# Patient Record
Sex: Male | Born: 1980 | Race: White | Hispanic: No | Smoking: Current every day smoker
Health system: Southern US, Community
[De-identification: ages and names within clinical notes are randomized; demographics above are authoritative.]

## PROBLEM LIST (undated history)

## (undated) DIAGNOSIS — F1110 Opioid abuse, uncomplicated: Secondary | ICD-10-CM

## (undated) DIAGNOSIS — M549 Dorsalgia, unspecified: Secondary | ICD-10-CM

## (undated) HISTORY — PX: TONSILLECTOMY: SUR1361

## (undated) HISTORY — PX: FOOT SURGERY: SHX648

---

## 2006-08-03 ENCOUNTER — Observation Stay (HOSPITAL_COMMUNITY): Admission: AC | Admit: 2006-08-03 | Discharge: 2006-08-03 | Payer: Self-pay | Admitting: Orthopedic Surgery

## 2006-08-21 ENCOUNTER — Inpatient Hospital Stay (HOSPITAL_COMMUNITY): Admission: EM | Admit: 2006-08-21 | Discharge: 2006-08-26 | Payer: Self-pay | Admitting: Emergency Medicine

## 2006-08-22 ENCOUNTER — Ambulatory Visit: Payer: Self-pay | Admitting: Infectious Diseases

## 2006-08-22 ENCOUNTER — Ambulatory Visit: Payer: Self-pay | Admitting: Vascular Surgery

## 2007-03-26 ENCOUNTER — Emergency Department (HOSPITAL_COMMUNITY): Admission: EM | Admit: 2007-03-26 | Discharge: 2007-03-26 | Payer: Self-pay | Admitting: Emergency Medicine
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
## 2010-07-04 NOTE — Discharge Summary (Signed)
[name], [name]              ACCOUNT NO.:  [id number]   MEDICAL RECORD NO.:  [id number]          PATIENT TYPE:  INP   LOCATION:  5703                         FACILITY:  [facility]   PHYSICIAN:  Erasmo Leventhal, M.D.DATE OF BIRTH:  1981-01-11   DATE OF ADMISSION:  08/21/2006  DATE OF DISCHARGE:  08/26/2006                               DISCHARGE SUMMARY   ADMITTING DIAGNOSIS:  Left foot infection status post nail puncture.   DISCHARGE DIAGNOSIS:  Left foot infection status post nail puncture.   OPERATION:  I&D left foot and nail puncture.   BRIEF HISTORY:  This is a 30 year old young man who sustained a nail  puncture 4-5 days before admission. The nail went through his shoe and  into his foot. He had progressive swelling and edema. He was admitted to  the hospital for evaluation, IV antibiotics and subsequent I&D.  The  surgery risks, benefits and aftercare were discussed with the patient.  An ID consult will be obtained.   LABORATORY VALUES:  CBC on admission within normal limits with the  exception of high  monocytes at 1.1. BMET met on admission within normal  limits with the exception of slightly high glucose of 106.  Wound  culture showed multiple organisms none predominant, rare gram-positive  cocci in pairs. No staph aureus isolated, no group B strep isolated.  Anaerobic culture showed rare gram-positive cocci in pairs, no anaerobes  isolated.   HOSPITAL COURSE:  The patient tolerated the operative procedure well. A  through and through drain was placed. Postoperatively he was started on  vancomycin pending culture results. The first postoperative day vital  signs were stable, he was afebrile. No bone or joint involvement was  noted. He was seen by the ID service and the patient was continued on  Cipro pending culture results. He did have a problem with slightly low  pulse the first day postoperatively probably secondary to his young age,  thin body habitus and  narcotics and his narcotics were backed off and  this improved.  The second postoperative day, vital signs were stable.  He was afebrile. He was reasonably comfortable.  Cultures were still  pending.  The third postoperative day, his foot pain was much better,  his vital signs were stable.  He was seen by infectious disease service  and this showed a mixed bacteria.  No staph aureus and he was switched  to Cipro and Augmentin, Cipro 750 b.i.d. , Augmentin 875 b.i.d.,  recommendation 2-3 weeks total per the ID service.  At this time his  vital signs stable are stable, he is afebrile, O2 97 on room air.  Lungs  were clear.  Heart sounds normal.  Bowel sounds active.  Calves  negative.  Dressing was changed. He had mild swelling in his foot but no  redness or streaking. Minimal discomfort.  Neurovascular status intact.  His drain had been removed by Dr. Ranell Patrick on the previous day and the  patient was stable and subsequently ready for discharge home and is  discharged home today for follow-up in the office.   CONDITION ON DISCHARGE:  Improved.   FOLLOW-UP:  On Wednesday.   DISCHARGE MEDICATIONS:  1. Augmentin 875 b.i.d.  2. Cipro 750 b.i.d.  3. Norco 5/325 1 q. 6h p.r.n. pain.   He is instructed to his crutches, elevate, keep the wound clean and dry.  Call the office for an appointment on Wednesday or call sooner p.r.n.  problems.      [name]. Chabon, P.A.    ______________________________  Erasmo Leventhal, M.D.    SJC/MEDQ  D:  08/26/2006  T:  08/26/2006  Job:  [id number]

## 2010-07-04 NOTE — Op Note (Signed)
NAMENASER, SCHULD              ACCOUNT NO.:  [id number]   MEDICAL RECORD NO.:  [id number]          PATIENT TYPE:  INP   LOCATION:  5703                         FACILITY:  [facility]   PHYSICIAN:  Erasmo Leventhal, M.D.DATE OF BIRTH:  September 24, 1980   DATE OF PROCEDURE:  08/23/2006  DATE OF DISCHARGE:                               OPERATIVE REPORT   I was asked to see Mr. Jurado at Dr. Barry Dienes' request.  The chart was  reviewed.  The patient was interviewed and examined and all studies were  reviewed.  I discussed with the patient's family the nature of his  problem.  They agreed for surgical intervention.  They understood the  risks and benefits, also discussed.   PREOPERATIVE DIAGNOSIS:  Left foot infection status post nail puncture.   POSTOPERATIVE DIAGNOSIS:  Left foot infection status post nail puncture.   PROCEDURE:  Irrigation and debridement of left foot nail puncture wound  infection.   SURGEON:  Erasmo Leventhal, M.D.   ANESTHESIA:  General.   ESTIMATED BLOOD LOSS:  None.   COMPLICATIONS:  None.   DISPOSITION:  To PACU stable.   TOURNIQUET TIME:  Focal ankle tourniquet 10 minutes.   FINDINGS:  There appeared to be no joint nor bone involvement.  The  infection appeared to be in the soft tissues, plantar aspect and medial.  It was well decompressed and I also did a through and through drain  which was left in place.  Cultures were sent.   DETAILS:  The patient's family counseled in the holding area.  Taken to  the operating room, placed under anesthesia.  The left foot elevated,  prepped with DuraPrep and draped in a sterile fashion.  Local ankle  Esmarch was utilized, making sure I stayed proximal to the area of  cellulitis and infection.   On the plantar aspect of his foot in area of localized swelling, no  fluctuance, with simply localized swelling.  In addition, the joint was  palpated and there appeared to be no involvement of the joint.   On the  plantar aspect, the puncture wound was excised in an elliptical  fashion.  A hemostat was placed face down.  The puncture area seemed to  go to the medial aspect into the soft tissue.  It did not seem to go  into the joint nor the bone.  We tracked this up to the medial aspect  where the area of localized swelling was.  This was taken through the  hemostat and bluntly split in the neurovascular tissues.  I then went to  the dorsal aspect with the hemostat and made a small puncture wound.  Cultures were sent.  There was no frank pus but there was some thick  serous fluid decompressed.  It was copiously irrigated with saline  through and through.  I then took a small vessel loop as a through and  through drain and placed that into the wound.  The tourniquet was  removed.  Normal circulation of the  foot and the toe at end of the case and sterile dressings applied.  He  is awakened, to recovery room in stable condition.  Sponge and needle  count correct.  No complications or problems.  The patient did well in  the recovery room at the time of the dictation.           ______________________________  Erasmo Leventhal, M.D.     RAC/MEDQ  D:  08/23/2006  T:  08/23/2006  Job:  [id number]
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
## 2010-12-05 LAB — DIFFERENTIAL
Basophils Absolute: 0
Eosinophils Relative: 1
Lymphocytes Relative: 22
Lymphs Abs: 2.2
Monocytes Absolute: 1.1 — ABNORMAL HIGH

## 2010-12-05 LAB — BASIC METABOLIC PANEL
GFR calc non Af Amer: >60
Glucose, Bld: 106 — ABNORMAL HIGH
Potassium: 3.5
Sodium: 138

## 2010-12-05 LAB — ANAEROBIC CULTURE

## 2010-12-05 LAB — CBC
HCT: 41
Hemoglobin: 13.9
RDW: 13.3

## 2010-12-05 LAB — WOUND CULTURE

## 2010-12-07 LAB — I-STAT 8, (EC8 V) (CONVERTED LAB)
BUN: 8
Chloride: 104
Glucose, Bld: 89
Potassium: 3.7
pH, Ven: 7.363 — ABNORMAL HIGH

## 2011-05-07 ENCOUNTER — Emergency Department (HOSPITAL_COMMUNITY)
Admission: EM | Admit: 2011-05-07 | Discharge: 2011-05-07 | Disposition: A | Discharge status: Home / Self Care | Payer: Self-pay | Attending: Emergency Medicine | Admitting: Emergency Medicine

## 2011-05-07 ENCOUNTER — Encounter (HOSPITAL_COMMUNITY): Payer: Self-pay | Admitting: *Deleted

## 2011-05-07 DIAGNOSIS — M545 Low back pain, unspecified: Secondary | ICD-10-CM | POA: Insufficient documentation

## 2011-05-07 DIAGNOSIS — M5416 Radiculopathy, lumbar region: Secondary | ICD-10-CM

## 2011-05-07 DIAGNOSIS — X500XXA Overexertion from strenuous movement or load, initial encounter: Secondary | ICD-10-CM | POA: Insufficient documentation

## 2011-05-07 DIAGNOSIS — F17200 Nicotine dependence, unspecified, uncomplicated: Secondary | ICD-10-CM | POA: Insufficient documentation

## 2011-05-07 DIAGNOSIS — IMO0002 Reserved for concepts with insufficient information to code with codable children: Secondary | ICD-10-CM | POA: Insufficient documentation

## 2011-05-07 HISTORY — DX: Dorsalgia, unspecified: M54.9

## 2011-05-07 MED ORDER — HYDROCODONE-ACETAMINOPHEN 5-325 MG PO TABS
1.0000 | ORAL_TABLET | Freq: Once | ORAL | Status: AC
  Administered 2011-05-07: 1 via ORAL
  Filled 2011-05-07: qty 1

## 2011-05-07 MED ORDER — HYDROCODONE-ACETAMINOPHEN 5-325 MG PO TABS: ORAL_TABLET | ORAL | Status: DC

## 2011-05-07 MED ORDER — PREDNISONE 50 MG PO TABS: ORAL_TABLET | ORAL | Status: AC

## 2011-05-07 MED ORDER — PREDNISONE 20 MG PO TABS
60.0000 mg | ORAL_TABLET | Freq: Once | ORAL | Status: AC
  Administered 2011-05-07: 60 mg via ORAL
  Filled 2011-05-07: qty 3

## 2011-05-07 NOTE — ED Notes (Signed)
Pain low back with radiation down rt leg. Onset 12n when bending down to pick up a bag of dog food.Marland Kitchen

## 2011-05-07 NOTE — ED Provider Notes (Signed)
History     CSN: [id number]  Arrival date & time 05/07/11  1238   First MD Initiated Contact with Patient 05/07/11 1413      Chief Complaint  Patient presents with  . Back Pain    (Consider location/radiation/quality/duration/timing/severity/associated sxs/prior treatment) Patient is a 31 y.o. male presenting with back pain. The history is provided by the patient. No language interpreter was used.  Back Pain  This is a new problem. The current episode started less than 1 hour ago. The problem occurs constantly. The problem has not changed since onset.The pain is associated with lifting heavy objects (bent over to pick a bag of dog food and felt his lower back pop.  pain since with radiation to E mid thigh.  describes as "burning".). The pain is present in the lumbar spine. The quality of the pain is described as stabbing. The pain radiates to the right thigh. The pain is at a severity of 8/10. The symptoms are aggravated by bending and certain positions. The pain is the same all the time. He has tried heat (made pain worse.) for the symptoms. The treatment provided no relief.    Past Medical History  Diagnosis Date  . Back pain     History reviewed. No pertinent past surgical history.  No family history on file.  History  Substance Use Topics  . Smoking status: Current Everyday Smoker -- 0.5 packs/day  . Smokeless tobacco: Not on file  . Alcohol Use: Yes     socially      Review of Systems  Musculoskeletal: Positive for back pain.  All other systems reviewed and are negative.    Allergies  Review of patient's allergies indicates no known allergies.  Home Medications   Current Outpatient Rx  Name Route Sig Dispense Refill  . HYDROCODONE-ACETAMINOPHEN 5-325 MG PO TABS  One tab po q 4-6 hrs prn pain 20 tablet 0  . PREDNISONE 50 MG PO TABS  One tab po QD 6 tablet 0    BP 133/83  Pulse 60  Temp 97.6 F (36.4 C)  Resp 20  Ht 6\' 6"  (1.981 m)  Wt 210 lb (95.255  kg)  BMI 24.27 kg/m2  SpO2 100%  Physical Exam  Nursing note and vitals reviewed. Constitutional: He is oriented to person, place, and time. He appears well-developed and well-nourished.  HENT:  Head: Normocephalic and atraumatic.  Eyes: EOM are normal.  Neck: Normal range of motion.  Cardiovascular: Normal rate, regular rhythm, normal heart sounds and intact distal pulses.   Pulmonary/Chest: Effort normal and breath sounds normal. No respiratory distress.  Abdominal: Soft. He exhibits no distension. There is no tenderness.  Musculoskeletal:       Lumbar back: He exhibits decreased range of motion and pain. He exhibits no tenderness and no bony tenderness.       Back:  Neurological: He is alert and oriented to person, place, and time. Coordination normal.  Skin: Skin is warm and dry.  Psychiatric: He has a normal mood and affect. Judgment normal.    ED Course  Procedures (including critical care time)  Labs Reviewed - No data to display No results found.   1. Lumbar back pain   2. Acute lumbar radiculopathy       MDM  rx-prednisone 50 mg rx-hydrocodone Ice F/u with PCP        Worthy Rancher, PA 05/07/11 1531  Worthy Rancher, [place] 05/07/11 [phone]

## 2011-05-07 NOTE — Discharge Instructions (Signed)
Back Pain, Adult Low back pain is very common. About 1 in 5 people have back pain.The cause of low back pain is rarely dangerous. The pain often gets better over time.About half of people with a sudden onset of back pain feel better in just 2 weeks. About 8 in 10 people feel better by 6 weeks.  CAUSES Some common causes of back pain include:  Strain of the muscles or ligaments supporting the spine.   Wear and tear (degeneration) of the spinal discs.   Arthritis.   Direct injury to the back.  DIAGNOSIS Most of the time, the direct cause of low back pain is not known.However, back pain can be treated effectively even when the exact cause of the pain is unknown.Answering your caregiver's questions about your overall health and symptoms is one of the most accurate ways to make sure the cause of your pain is not dangerous. If your caregiver needs more information, he or she may order lab work or imaging tests (X-rays or MRIs).However, even if imaging tests show changes in your back, this usually does not require surgery. HOME CARE INSTRUCTIONS For many people, back pain returns.Since low back pain is rarely dangerous, it is often a condition that people can learn to manageon their own.   Remain active. It is stressful on the back to sit or stand in one place. Do not sit, drive, or stand in one place for more than 30 minutes at a time. Take short walks on level surfaces as soon as pain allows.Try to increase the length of time you walk each day.   Do not stay in bed.Resting more than 1 or 2 days can delay your recovery.   Do not avoid exercise or work.Your body is made to move.It is not dangerous to be active, even though your back may hurt.Your back will likely heal faster if you return to being active before your pain is gone.   Pay attention to your body when you bend and lift. Many people have less discomfortwhen lifting if they bend their knees, keep the load close to their  bodies,and avoid twisting. Often, the most comfortable positions are those that put less stress on your recovering back.   Find a comfortable position to sleep. Use a firm mattress and lie on your side with your knees slightly bent. If you lie on your back, put a pillow under your knees.   Only take over-the-counter or prescription medicines as directed by your caregiver. Over-the-counter medicines to reduce pain and inflammation are often the most helpful.Your caregiver may prescribe muscle relaxant drugs.These medicines help dull your pain so you can more quickly return to your normal activities and healthy exercise.   Put ice on the injured area.   Put ice in a plastic bag.   Place a towel between your skin and the bag.   Leave the ice on for 15 to 20 minutes, 3 to 4 times a day for the first 2 to 3 days. After that, ice and heat may be alternated to reduce pain and spasms.   Ask your caregiver about trying back exercises and gentle massage. This may be of some benefit.   Avoid feeling anxious or stressed.Stress increases muscle tension and can worsen back pain.It is important to recognize when you are anxious or stressed and learn ways to manage it.Exercise is a great option.  SEEK MEDICAL CARE IF:  You have pain that is not relieved with rest or medicine.   You have   pain that does not improve in 1 week.   You have new symptoms.   You are generally not feeling well.  SEEK IMMEDIATE MEDICAL CARE IF:   You have pain that radiates from your back into your legs.   You develop new bowel or bladder control problems.   You have unusual weakness or numbness in your arms or legs.   You develop nausea or vomiting.   You develop abdominal pain.   You feel faint.  Document Released: 02/05/2005 Document Revised: 01/25/2011 Document Reviewed: 06/26/2010 [facility] Patient Information 2012 [place], [place].Cryotherapy Cryotherapy means treatment with cold. Ice or gel packs can be  used to reduce both pain and swelling. Ice is the most helpful within the first 24 to 48 hours after an injury or flareup from overusing a muscle or joint. Sprains, strains, spasms, burning pain, shooting pain, and aches can all be eased with ice. Ice can also be used when recovering from surgery. Ice is effective, has very few side effects, and is safe for most people to use. PRECAUTIONS  Ice is not a safe treatment option for people with:  Raynaud's phenomenon. This is a condition affecting small blood vessels in the extremities. Exposure to cold may cause your problems to return.   Cold hypersensitivity. There are many forms of cold hypersensitivity, including:   Cold urticaria. Red, itchy hives appear on the skin when the tissues begin to warm after being iced.   Cold erythema. This is a red, itchy rash caused by exposure to cold.   Cold hemoglobinuria. Red blood cells break down when the tissues begin to warm after being iced. The hemoglobin that carry oxygen are passed into the urine because they cannot combine with blood proteins fast enough.   Numbness or altered sensitivity in the area being iced.  If you have any of the following conditions, do not use ice until you have discussed cryotherapy with your caregiver:  Heart conditions, such as arrhythmia, angina, or chronic heart disease.   High blood pressure.   Healing wounds or open skin in the area being iced.   Current infections.   Rheumatoid arthritis.   Poor circulation.   Diabetes.  Ice slows the blood flow in the region it is applied. This is beneficial when trying to stop inflamed tissues from spreading irritating chemicals to surrounding tissues. However, if you expose your skin to cold temperatures for too long or without the proper protection, you can damage your skin or nerves. Watch for signs of skin damage due to cold. HOME CARE INSTRUCTIONS Follow these tips to use ice and cold packs safely.  Place a dry or  damp towel between the ice and skin. A damp towel will cool the skin more quickly, so you may need to shorten the time that the ice is used.   For a more rapid response, add gentle compression to the ice.   Ice for no more than 10 to 20 minutes at a time. The bonier the area you are icing, the less time it will take to get the benefits of ice.   Check your skin after 5 minutes to make sure there are no signs of a poor response to cold or skin damage.   Rest 20 minutes or more in between uses.   Once your skin is numb, you can end your treatment. You can test numbness by very lightly touching your skin. The touch should be so light that you do not see the skin dimple from  the pressure of your fingertip. When using ice, most people will feel these normal sensations in this order: cold, burning, aching, and numbness.   Do not use ice on someone who cannot communicate their responses to pain, such as small children or people with dementia.  HOW TO MAKE AN ICE PACK Ice packs are the most common way to use ice therapy. Other methods include ice massage, ice baths, and cryo-sprays. Muscle creams that cause a cold, tingly feeling do not offer the same benefits that ice offers and should not be used as a substitute unless recommended by your caregiver. To make an ice pack, do one of the following:  Place crushed ice or a bag of frozen vegetables in a sealable plastic bag. Squeeze out the excess air. Place this bag inside another plastic bag. Slide the bag into a pillowcase or place a damp towel between your skin and the bag.   Mix 3 parts water with 1 part rubbing alcohol. Freeze the mixture in a sealable plastic bag. When you remove the mixture from the freezer, it will be slushy. Squeeze out the excess air. Place this bag inside another plastic bag. Slide the bag into a pillowcase or place a damp towel between your skin and the bag.  SEEK MEDICAL CARE IF:  You develop white spots on your skin. This  may give the skin a blotchy (mottled) appearance.   Your skin turns blue or pale.   Your skin becomes waxy or hard.   Your swelling gets worse.  MAKE SURE YOU:   Understand these instructions.   Will watch your condition.   Will get help right away if you are not doing well or get worse.  Document Released: 10/02/2010 Document Revised: 01/25/2011 Document Reviewed: 10/02/2010 [facility] Patient Information 2012 [place], [place].   Take the meds as directed.  Apply ice 20-30 min several times daily.  Do NOT take any NSAID's  While taking the prednisone.  Follow up with your PCP.  If your symptoms worsen or don't improve you may need an MRI to further evaluate.

## 2011-05-07 NOTE — ED Notes (Signed)
sttaes he was bending over to get a bag of dog food and his back began to hurt, c/o pain in lower back

## 2011-05-11 NOTE — ED Provider Notes (Signed)
Medical screening examination/treatment/procedure(s) were performed by non-physician practitioner and as supervising physician I was immediately available for consultation/collaboration.   Shelda Jakes, MD 05/11/11 1003

## 2015-03-15 ENCOUNTER — Encounter (HOSPITAL_COMMUNITY): Payer: Self-pay | Admitting: Emergency Medicine

## 2015-03-15 ENCOUNTER — Emergency Department (HOSPITAL_COMMUNITY)
Admission: EM | Admit: 2015-03-15 | Discharge: 2015-03-15 | Disposition: A | Discharge status: Home / Self Care | Payer: Self-pay | Attending: Emergency Medicine | Admitting: Emergency Medicine

## 2015-03-15 DIAGNOSIS — R110 Nausea: Secondary | ICD-10-CM | POA: Insufficient documentation

## 2015-03-15 DIAGNOSIS — R590 Localized enlarged lymph nodes: Secondary | ICD-10-CM | POA: Insufficient documentation

## 2015-03-15 DIAGNOSIS — F17210 Nicotine dependence, cigarettes, uncomplicated: Secondary | ICD-10-CM | POA: Insufficient documentation

## 2015-03-15 DIAGNOSIS — J039 Acute tonsillitis, unspecified: Secondary | ICD-10-CM | POA: Insufficient documentation

## 2015-03-15 MED ORDER — AMOXICILLIN 500 MG PO CAPS: 500.0000 mg | ORAL_CAPSULE | Freq: Three times a day (TID) | ORAL | Status: DC

## 2015-03-15 MED ORDER — HYDROCODONE-ACETAMINOPHEN 5-325 MG PO TABS: 1.0000 | ORAL_TABLET | Freq: Four times a day (QID) | ORAL | Status: DC | PRN

## 2015-03-15 NOTE — ED Notes (Signed)
Patient complaining of body aches with sore throat since yesterday.

## 2015-03-15 NOTE — Discharge Instructions (Signed)
Continue to take ibuprofen as needed for fever or pain. Do not take the narcotic that we are giving you for pain and cough if you are driving as it will make you sleepy.   Tonsillitis Tonsillitis is an infection of the throat that causes the tonsils to become red, tender, and swollen. Tonsils are collections of lymphoid tissue at the back of the throat. Each tonsil has crevices (crypts). Tonsils help fight nose and throat infections and keep infection from spreading to other parts of the body for the first 18 months of life.  CAUSES Sudden (acute) tonsillitis is usually caused by infection with streptococcal bacteria. Long-lasting (chronic) tonsillitis occurs when the crypts of the tonsils become filled with pieces of food and bacteria, which makes it easy for the tonsils to become repeatedly infected. SYMPTOMS  Symptoms of tonsillitis include:  A sore throat, with possible difficulty swallowing.  White patches on the tonsils.  Fever.  Tiredness.  New episodes of snoring during sleep, when you did not snore before.  Small, foul-smelling, yellowish-white pieces of material (tonsilloliths) that you occasionally cough up or spit out. The tonsilloliths can also cause you to have bad breath. DIAGNOSIS Tonsillitis can be diagnosed through a physical exam. Diagnosis can be confirmed with the results of lab tests, including a throat culture. TREATMENT  The goals of tonsillitis treatment include the reduction of the severity and duration of symptoms and prevention of associated conditions. Symptoms of tonsillitis can be improved with the use of steroids to reduce the swelling. Tonsillitis caused by bacteria can be treated with antibiotic medicines. Usually, treatment with antibiotic medicines is started before the cause of the tonsillitis is known. However, if it is determined that the cause is not bacterial, antibiotic medicines will not treat the tonsillitis. If attacks of tonsillitis are severe and  frequent, your health care provider may recommend surgery to remove the tonsils (tonsillectomy). HOME CARE INSTRUCTIONS   Rest as much as possible and get plenty of sleep.  Drink plenty of fluids. While the throat is very sore, eat soft foods or liquids, such as sherbet, soups, or instant breakfast drinks.  Eat frozen ice pops.  Gargle with a warm or cold liquid to help soothe the throat. Mix 1/4 teaspoon of salt and 1/4 teaspoon of baking soda in 8 oz of water. SEEK MEDICAL CARE IF:   Large, tender lumps develop in your neck.  A rash develops.  A green, yellow-brown, or bloody substance is coughed up.  You are unable to swallow liquids or food for 24 hours.  You notice that only one of the tonsils is swollen. SEEK IMMEDIATE MEDICAL CARE IF:   You develop any new symptoms such as vomiting, severe headache, stiff neck, chest pain, or trouble breathing or swallowing.  You have severe throat pain along with drooling or voice changes.  You have severe pain, unrelieved with recommended medications.  You are unable to fully open the mouth.  You develop redness, swelling, or severe pain anywhere in the neck.  You have a fever. MAKE SURE YOU:   Understand these instructions.  Will watch your condition.  Will get help right away if you are not doing well or get worse.   This information is not intended to replace advice given to you by your health care provider. Make sure you discuss any questions you have with your health care provider.   Document Released: 11/15/2004 Document Revised: 02/26/2014 Document Reviewed: 07/25/2012 Elsevier Interactive Patient Education [employer]

## 2015-03-15 NOTE — ED Provider Notes (Signed)
CSN: [id number]     Arrival date & time 03/15/15  1609 History   First MD Initiated Contact with Patient 03/15/15 1635     Chief Complaint  Patient presents with  . Sore Throat  . Generalized Body Aches     (Consider location/radiation/quality/duration/timing/severity/associated sxs/prior Treatment) Patient is a 35 y.o. male presenting with pharyngitis. The history is provided by the patient.  Sore Throat This is a new problem. The current episode started yesterday. The problem occurs constantly. The problem has been gradually worsening. Associated symptoms include chills, a fever, nausea, a sore throat and swollen glands. The symptoms are aggravated by swallowing. He has tried acetaminophen and NSAIDs for the symptoms. The treatment provided mild relief.   [name] is a 35 y.o. male who presents to the ED with sore throat, fever and body aches that started yesterday.  Past Medical History  Diagnosis Date  . Back pain    Past Surgical History  Procedure Laterality Date  . Foot surgery     History reviewed. No pertinent family history. Social History  Substance Use Topics  . Smoking status: Current Every Day Smoker -- 0.50 packs/day    Types: Cigarettes  . Smokeless tobacco: None  . Alcohol Use: No     Comment: denies    Review of Systems  Constitutional: Positive for fever and chills.  HENT: Positive for sore throat.   Gastrointestinal: Positive for nausea.   All other systems negtive   Allergies  Review of patient's allergies indicates no known allergies.  Home Medications   Prior to Admission medications   Medication Sig Start Date End Date Taking? Authorizing Provider  amoxicillin (AMOXIL) 500 MG capsule Take 1 capsule (500 mg total) by mouth 3 (three) times daily. 03/15/15   Hope Orlene Och, NP  HYDROcodone-acetaminophen (NORCO/VICODIN) 5-325 MG tablet Take 1 tablet by mouth every 6 (six) hours as needed (cough and pain). 03/15/15   Hope Orlene Och, NP   BP  132/74 mmHg  Pulse 80  Temp(Src) 99.3 F (37.4 C) (Oral)  Resp 16  Ht  (1.981 m)  Wt 99.791 kg  BMI 25.43 kg/m2  SpO2 99% Physical Exam  Constitutional: He is oriented to person, place, and time. He appears well-developed and well-nourished. No distress.  HENT:  Head: Normocephalic and atraumatic.  Right Ear: Tympanic membrane normal.  Left Ear: Tympanic membrane normal.  Nose: Rhinorrhea present.  Mouth/Throat: Uvula is midline and mucous membranes are normal. Oropharyngeal exudate and posterior oropharyngeal erythema present.  Tonsils enlarged with exudate  Eyes: EOM are normal.  Neck: Normal range of motion. Neck supple.  Cardiovascular: Normal rate and regular rhythm.   Pulmonary/Chest: Effort normal. He has no wheezes. He has no rales.  Abdominal: Soft. Bowel sounds are normal. There is no tenderness.  Musculoskeletal: Normal range of motion.  Lymphadenopathy:    He has cervical adenopathy.  Neurological: He is alert and oriented to person, place, and time. No cranial nerve deficit.  Skin: Skin is warm and dry.  Psychiatric: He has a normal mood and affect. His behavior is normal.  Nursing note and vitals reviewed.   ED Course  Procedures  MDM  35 y.o. male with sore throat, fever and body aches that started 24 hours prior to arrival to the ED. Stable for d/c without difficulty swallowing and no tonsillar abscess. Will treat for tonsillitis and pain. Patient to follow up with his PCP or return here if symptoms worsen. Discussed with the patient and  all questioned fully answered.   Final diagnoses:  Tonsillitis with exudate     Janne Napoleon, NP 03/16/15 1420  Lavera Guise, MD 03/16/15 [phone]

## 2015-03-30 ENCOUNTER — Emergency Department (HOSPITAL_COMMUNITY)
Admission: EM | Admit: 2015-03-30 | Discharge: 2015-03-30 | Disposition: A | Discharge status: Home / Self Care | Payer: Self-pay | Attending: Emergency Medicine | Admitting: Emergency Medicine

## 2015-03-30 ENCOUNTER — Emergency Department (HOSPITAL_COMMUNITY): Payer: Self-pay

## 2015-03-30 ENCOUNTER — Encounter (HOSPITAL_COMMUNITY): Payer: Self-pay | Admitting: Emergency Medicine

## 2015-03-30 DIAGNOSIS — R9431 Abnormal electrocardiogram [ECG] [EKG]: Secondary | ICD-10-CM | POA: Insufficient documentation

## 2015-03-30 DIAGNOSIS — F17210 Nicotine dependence, cigarettes, uncomplicated: Secondary | ICD-10-CM | POA: Insufficient documentation

## 2015-03-30 DIAGNOSIS — R079 Chest pain, unspecified: Secondary | ICD-10-CM | POA: Insufficient documentation

## 2015-03-30 LAB — BASIC METABOLIC PANEL
Anion gap: 10 (ref 5–15)
BUN: 12 mg/dL (ref 6–20)
CALCIUM: 9.3 mg/dL (ref 8.9–10.3)
CO2: 27 mmol/L (ref 22–32)
Chloride: 100 mmol/L — ABNORMAL LOW (ref 101–111)
Creatinine, Ser: 1.03 mg/dL (ref 0.61–1.24)
GFR calc Af Amer: >60 mL/min (ref >60)
GLUCOSE: 98 mg/dL (ref 65–99)
POTASSIUM: 4.2 mmol/L (ref 3.5–5.1)
Sodium: 137 mmol/L (ref 135–145)

## 2015-03-30 LAB — CBC
HEMATOCRIT: 41.3 % (ref 39.0–52.0)
HEMOGLOBIN: 13.9 g/dL (ref 13.0–17.0)
MCH: 29.9 pg (ref 26.0–34.0)
MCHC: 33.7 g/dL (ref 30.0–36.0)
MCV: 88.8 fL (ref 78.0–100.0)
Platelets: 272 10*3/uL (ref 150–400)
RBC: 4.65 MIL/uL (ref 4.22–5.81)
RDW: 13.2 % (ref 11.5–15.5)
WBC: 7.4 10*3/uL (ref 4.0–10.5)

## 2015-03-30 LAB — TROPONIN I: Troponin I: <0.03 ng/mL (ref <0.031)

## 2015-03-30 MED ORDER — ONDANSETRON HCL 4 MG/2ML IJ SOLN
4.0000 mg | Freq: Once | INTRAMUSCULAR | Status: AC
  Administered 2015-03-30: 4 mg via INTRAVENOUS
  Filled 2015-03-30: qty 2

## 2015-03-30 MED ORDER — ASPIRIN 81 MG PO CHEW
324.0000 mg | CHEWABLE_TABLET | Freq: Once | ORAL | Status: AC
  Administered 2015-03-30: 324 mg via ORAL
  Filled 2015-03-30: qty 4

## 2015-03-30 MED ORDER — MORPHINE SULFATE (PF) 4 MG/ML IV SOLN
4.0000 mg | Freq: Once | INTRAVENOUS | Status: AC
  Administered 2015-03-30: 4 mg via INTRAVENOUS
  Filled 2015-03-30: qty 1

## 2015-03-30 NOTE — Discharge Instructions (Signed)
Try to avoid stressful situations. Consider stopping tobacco use. Follow-up with a primary care doctor your abnormal EKG in 2-3 weeks. Return here, if needed, for problems.  Nonspecific Chest Pain  Chest pain can be caused by many different conditions. There is always a chance that your pain could be related to something serious, such as a heart attack or a blood clot in your lungs. Chest pain can also be caused by conditions that are not life-threatening. If you have chest pain, it is very important to follow up with your health care provider. CAUSES  Chest pain can be caused by:  Heartburn.  Pneumonia or bronchitis.  Anxiety or stress.  Inflammation around your heart (pericarditis) or lung (pleuritis or pleurisy).  A blood clot in your lung.  A collapsed lung (pneumothorax). It can develop suddenly on its own (spontaneous pneumothorax) or from trauma to the chest.  Shingles infection (varicella-zoster virus).  Heart attack.  Damage to the bones, muscles, and cartilage that make up your chest wall. This can include:  Bruised bones due to injury.  Strained muscles or cartilage due to frequent or repeated coughing or overwork.  Fracture to one or more ribs.  Sore cartilage due to inflammation (costochondritis). RISK FACTORS  Risk factors for chest pain may include:  Activities that increase your risk for trauma or injury to your chest.  Respiratory infections or conditions that cause frequent coughing.  Medical conditions or overeating that can cause heartburn.  Heart disease or family history of heart disease.  Conditions or health behaviors that increase your risk of developing a blood clot.  Having had chicken pox (varicella zoster). SIGNS AND SYMPTOMS Chest pain can feel like:  Burning or tingling on the surface of your chest or deep in your chest.  Crushing, pressure, aching, or squeezing pain.  Dull or sharp pain that is worse when you move, cough, or take  a deep breath.  Pain that is also felt in your back, neck, shoulder, or arm, or pain that spreads to any of these areas. Your chest pain may come and go, or it may stay constant. DIAGNOSIS Lab tests or other studies may be needed to find the cause of your pain. Your health care provider may have you take a test called an ambulatory ECG (electrocardiogram). An ECG records your heartbeat patterns at the time the test is performed. You may also have other tests, such as:  Transthoracic echocardiogram (TTE). During echocardiography, sound waves are used to create a picture of all of the heart structures and to look at how blood flows through your heart.  Transesophageal echocardiogram (TEE).This is a more advanced imaging test that obtains images from inside your body. It allows your health care provider to see your heart in finer detail.  Cardiac monitoring. This allows your health care provider to monitor your heart rate and rhythm in real time.  Holter monitor. This is a portable device that records your heartbeat and can help to diagnose abnormal heartbeats. It allows your health care provider to track your heart activity for several days, if needed.  Stress tests. These can be done through exercise or by taking medicine that makes your heart beat more quickly.  Blood tests.  Imaging tests. TREATMENT  Your treatment depends on what is causing your chest pain. Treatment may include:  Medicines. These may include:  Acid blockers for heartburn.  Anti-inflammatory medicine.  Pain medicine for inflammatory conditions.  Antibiotic medicine, if an infection is present.  Medicines to  dissolve blood clots.  Medicines to treat coronary artery disease.  Supportive care for conditions that do not require medicines. This may include:  Resting.  Applying heat or cold packs to injured areas.  Limiting activities until pain decreases. HOME CARE INSTRUCTIONS  If you were prescribed an  antibiotic medicine, finish it all even if you start to feel better.  Avoid any activities that bring on chest pain.  Do not use any tobacco products, including cigarettes, chewing tobacco, or electronic cigarettes. If you need help quitting, ask your health care provider.  Do not drink alcohol.  Take medicines only as directed by your health care provider.  Keep all follow-up visits as directed by your health care provider. This is important. This includes any further testing if your chest pain does not go away.  If heartburn is the cause for your chest pain, you may be told to keep your head raised (elevated) while sleeping. This reduces the chance that acid will go from your stomach into your esophagus.  Make lifestyle changes as directed by your health care provider. These may include:  Getting regular exercise. Ask your health care provider to suggest some activities that are safe for you.  Eating a heart-healthy diet. A registered dietitian can help you to learn healthy eating options.  Maintaining a healthy weight.  Managing diabetes, if necessary.  Reducing stress. SEEK MEDICAL CARE IF:  Your chest pain does not go away after treatment.  You have a rash with blisters on your chest.  You have a fever. SEEK IMMEDIATE MEDICAL CARE IF:   Your chest pain is worse.  You have an increasing cough, or you cough up blood.  You have severe abdominal pain.  You have severe weakness.  You faint.  You have chills.  You have sudden, unexplained chest discomfort.  You have sudden, unexplained discomfort in your arms, back, neck, or jaw.  You have shortness of breath at any time.  You suddenly start to sweat, or your skin gets clammy.  You feel nauseous or you vomit.  You suddenly feel light-headed or dizzy.  Your heart begins to beat quickly, or it feels like it is skipping beats. These symptoms may represent a serious problem that is an emergency. Do not wait to  see if the symptoms will go away. Get medical help right away. Call your local emergency services (911 in the [place] Do not drive yourself to the hospital.   This information is not intended to replace advice given to you by your health care provider. Make sure you discuss any questions you have with your health care provider.   Document Released: 11/15/2004 Document Revised: 02/26/2014 Document Reviewed: 09/11/2013 Elsevier Interactive Patient Education 2016 [employer].  Emergency Department Resource Guide 1) Find a Doctor and Pay Out of Pocket Although you won't have to find out who is covered by your insurance plan, it is a good idea to ask around and get recommendations. You will then need to call the office and see if the doctor you have chosen will accept you as a new patient and what types of options they offer for patients who are self-pay. Some doctors offer discounts or will set up payment plans for their patients who do not have insurance, but you will need to ask so you aren't surprised when you get to your appointment.  2) Contact Your Local Health Department Not all health departments have doctors that can see patients for sick visits, but many do, so  it is worth a call to see if yours does. If you don't know where your local health department is, you can check in your phone book. The CDC also has a tool to help you locate your state's health department, and many state websites also have listings of all of their local health departments.  3) Find a Walk-in Clinic If your illness is not likely to be very severe or complicated, you may want to try a walk in clinic. These are popping up all over the country in pharmacies, drugstores, and shopping centers. They're usually staffed by nurse practitioners or physician assistants that have been trained to treat common illnesses and complaints. They're usually fairly quick and inexpensive. However, if you have serious medical issues or chronic  medical problems, these are probably not your best option.  No Primary Care Doctor: - Call Health Connect at  [phone] - they can help you locate a primary care doctor that  accepts your insurance, provides certain services, etc. - Physician Referral Service- [phone]  Chronic Pain Problems: Organization         Address  Phone   Notes  [facility]  [phone] Patients need to be referred by their primary care doctor.   Medication Assistance: Organization         Address  Phone   Notes  [facility] Medication [facility] [address] [place], [place] [address] [phone] --Must be a resident of [facility] -- Must have NO insurance coverage whatsoever (no Medicaid/ Medicare, etc.) -- The pt. MUST have a primary care doctor that directs their care regularly and follows them in the community   [facility]ist  [phone]   [employer]  [phone]    Agencies that provide inexpensive medical care: Organization         Address  Phone   Notes  [facility]  [phone]   [facility]    [phone]   [facility] [address] [place], [place] [address] [phone]   [facility] [address] [place], [place] [phone]   Planned Parenthood    [phone]   [facility]    [phone]   [facility]  [address], [place] Phone:  [phone], Fax:  [phone] Hours of Operation:  9 am - 6 pm, M-F.  Also accepts Medicaid/Medicare and self-pay.  [facility]  [address], [place] Phone: [phone], Fax: [phone]. Hours of Operation:  8:30 am - 5:30 pm, M-F.  Also accepts Medicaid and self-pay.  [facility] [place] [address], [place] Phone: [phone]   [facility] [address] Natasha Bence [place], [place] [phone],  Ext. [phone] Mondays & Thursdays: 7-9 AM.  First 15 patients are seen on a first come, first serve basis.    Medicaid-accepting [facility] Providers:  Organization         Address  Phone   Notes  [facility] [address], [place] [phone] Also accepts self-pay patients.  [facility] [address] Laurell Josephs [place], [place]  [phone]   [facility] [address], [place] [phone]   [facility] [address], [place] [phone]   Renaye Rakers [address], [place]   [phone] Only accepts [place] patients after they have their name applied to their card.   Self-Pay (no insurance) in [facility]:  Organization         Address  Phone   Notes  Sickle Cell Patients, [facility] Internal Medicine [address] [place], [place] [phone]   [facility] Urgent Care [address] [place], [place] [phone]   [facility] [place]  [address], [place] [phone]   [facility]/Dr. Osei-Bonsu  [address] [place] or [address], [place] [phone] Phone number for both [place] and [place] locations is the same.  [facility] [address] [place] [phone]   [facility] [address], [place] or [address] [phone]   [facility] [address] [place] [phone], phone; [phone], fax Sees patients 1st and 3rd Saturday of every month.  Must not qualify for public or private insurance (i.e. Medicaid, Medicare, [place] Health Choice, Veterans' Benefits)  Household income should be no more than 200% of the poverty level The clinic cannot treat you if you are pregnant or think you are pregnant  Sexually transmitted diseases are not  treated at the clinic.    Dental Care: Organization         Address  Phone  Notes  [facility] Department of [facility] [address] [place], [place] [phone] Accepts children up to age 56 who are enrolled in [place] or [place] Health Choice; pregnant women with a Medicaid card; and children who have applied for Medicaid or [place] Health Choice, but were declined, whose parents can pay a reduced fee at time of service.  [facility] Department of [facility]  [address], [place] [phone] Accepts children up to age 65 who are enrolled in [place] or [place] Health Choice; pregnant women with a Medicaid card; and children who have applied for Medicaid or [place] Health Choice, but were declined, whose parents can pay a reduced fee at time of service.  [facility]  [address] [place], [place] [phone] Patients are seen by appointment only. Walk-ins are not accepted. [facility] will see patients 65 years of age and older. Monday - Tuesday (8am-5pm) Most Wednesdays (8:30-5pm) $30 per visit, cash only  [facility] Adult Dental Access PROGRAM  [address], [facility] [phone] Patients are seen by appointment only. Walk-ins are not accepted. [facility] will see patients 89 years of age and older. One Wednesday Evening (Monthly: Volunteer Based).  $30 per visit, cash only  [employer]  [phone] for adults; Children under age 58, call Graduate Pediatric Dentistry at [phone]. Children aged 19-14, please call [phone] to request a pediatric application.  Dental services are provided in all areas of dental care including fillings, crowns and bridges, complete and partial dentures, implants, gum treatment, root canals, and extractions. Preventive care is also provided. Treatment is provided to both adults and children. Patients are selected via a lottery and there is  often a waiting list.   [facility] [address], [place]  [phone] [email or web address]   [facility] [address] [place], [place] [phone], Ext. [phone] Second and Fourth  Thursday of each month, opens at 6:30 AM; Clinic ends at 9 AM.  Patients are seen on a first-come first-served basis, and a limited number are seen during each clinic.   [facility]  [address] Ether Griffins [place], [place] [phone]   Eligibility Requirements You must have lived in [place], [place], or [place] counties for at least the last three months.   You cannot be eligible for state or federal sponsored [place], including [employer], [place], or [employer].   You generally cannot be eligible for healthcare insurance through your employer.    How to apply: Eligibility screenings are held every Tuesday and Wednesday afternoon from 1:00 pm until 4:00 pm. You do not need an appointment for the interview!  [facility] [address], [place], [place] [phone]   [facility] Health Department  [phone]   [facility] Health Department  [phone]   [facility] Health Department  [phone]    Behavioral Health Resources in the Community: Intensive Outpatient Programs Organization         Address  Phone  Notes  [facility] Services [address] [place], [place] [phone]   [facility] Outpatient [address], [place], [place] [phone]   [facility] [address] [place], [place]  [phone]   [facility] Mental Health [address]  [place], [place] [phone] or [phone]   Substance Abuse Resources Organization         Address  Phone  Notes  [facility]  [phone]   [facility]  [phone]   [facility]  [phone]   Floydene Flock  [phone]   Residential & Outpatient Substance  Abuse Program  [phone]   Psychological Services Organization         Address  Phone  Notes  [facility] Behavioral Health  [phone]   [facility] Services  [phone]   [facility] Mental Health [address] [place] [phone] or [phone]    Mobile Crisis Teams Organization         Address  Phone  Notes  [facility], Mobile Crisis Care Unit  [phone]   [facility]  [address] [place], [place] [phone]   Doristine Locks [address] [place] [phone]    Self-Help/Support Groups Organization         Address  Phone             Notes  [facility] [place] - variety of support groups  [phone]- I7437963 Call for more information  Narcotics Anonymous (NA), [facility] [address], [employer] [place]  2 meetings at this location   Statistician         Address  Phone  Notes  [facility] [address],    [place]  [phone]   [facility]  [address], [place] [address], [place], [place] [phone]   [facility] Treatment Facility [address] [place], [place] [phone] Admissions: 8am-3pm M-F  [facility] [address],    [place], [place] [phone]   [facility] [address] Starling Manns [place], [place] [phone]   The  [facility] [address]  [place], [place] [phone]   [facility] - Intensive Outpatient [address] Laurell [address], [place], [place] [phone]   [facility] ([facility] [phone]  [place], [place] [phone] or [phone]   [facility] ([facility]) [address] [place], [place] [phone] Accepts Medicaid  [address] [facility] [address]  [place] [phone] Substance Abuse/Addiction Treatment   [facility] Behavioral Health Resources Organization          Address  Phone  Notes  [facility]  [phone]) [phone]   Angie FavaJulie Brannon, PhD [address] [place], [place]   [phone] or [phone]   [facility]   [address] [place], [place] [phone]) [phone]   [facility] [address], [place], [place] [phone] Insurance/Medicaid/sponsorship through [facility]  [facility] [address]                                    [place], [place] [phone] Therapy/tele-psych/case  [facility] [address]   [place], [place] [phone]) [phone]    Dr. Lolly MustacheArfeen  [phone]   [facility] [place]  [employer] [facility] 1) [address], [place] 2) [address], [place] 3)  [address], [place] [phone]) [phone]  [phone](336) [phone]   [facility] Child Abuse Hotline [phone]) [phone] or [phone]) [phone] (After Hours)

## 2015-03-30 NOTE — ED Provider Notes (Signed)
CSN: [id number]     Arrival date & time 03/30/15  1527 History   First MD Initiated Contact with Patient 03/30/15 1557     Chief Complaint  Patient presents with  . Chest Pain     (Consider location/radiation/quality/duration/timing/severity/associated sxs/prior Treatment) HPI   KAMRYN GAUTHIER is a 35 y.o. male here for evaluation of chest discomfort which occurred early this morning, when he was arguing and feeling stressed. The pain feels "like an elephant" and is worse with deep inspiration. He has not had this previously. He smokes cigarettes. He denies cough, shortness of breath, nausea, vomiting or diaphoresis. He denies use of illegal drugs. He has been on medications for depression in the past but stopped them because of financial difficulty. There are no other no modifying factors.   Past Medical History  Diagnosis Date  . Back pain    Past Surgical History  Procedure Laterality Date  . Foot surgery     History reviewed. No pertinent family history. Social History  Substance Use Topics  . Smoking status: Current Every Day Smoker -- 0.50 packs/day    Types: Cigarettes  . Smokeless tobacco: None  . Alcohol Use: No     Comment: denies    Review of Systems  All other systems reviewed and are negative.     Allergies  Review of patient's allergies indicates no known allergies.  Home Medications   Prior to Admission medications   Medication Sig Start Date End Date Taking? Authorizing Provider  amoxicillin (AMOXIL) 500 MG capsule Take 1 capsule (500 mg total) by mouth 3 (three) times daily. Patient not taking: Reported on 03/30/2015 03/15/15   Janne Napoleon, NP  HYDROcodone-acetaminophen (NORCO/VICODIN) 5-325 MG tablet Take 1 tablet by mouth every 6 (six) hours as needed (cough and pain). Patient not taking: Reported on 03/30/2015 03/15/15   Janne Napoleon, NP   BP 116/76 mmHg  Pulse 59  Temp(Src) 99 F (37.2 C) (Oral)  Resp 17  Ht  (1.981 m)  Wt 200 lb (90.719  kg)  BMI 23.12 kg/m2  SpO2 100% Physical Exam  Constitutional: He is oriented to person, place, and time. He appears well-developed and well-nourished. He appears distressed (he is uncomfoable, and tearful).  HENT:  Head: Normocephalic and atraumatic.  Right Ear: External ear normal.  Left Ear: External ear normal.  Eyes: Conjunctivae and EOM are normal. Pupils are equal, round, and reactive to light.  Neck: Normal range of motion and phonation normal. Neck supple.  Cardiovascular: Normal rate, regular rhythm and normal heart sounds.   Pulmonary/Chest: Effort normal and breath sounds normal. He exhibits no bony tenderness.  Abdominal: Soft. There is no tenderness.  Musculoskeletal: Normal range of motion.  Neurological: He is alert and oriented to person, place, and time. No cranial nerve deficit or sensory deficit. He exhibits normal muscle tone. Coordination normal.  Skin: Skin is warm, dry and intact.  Psychiatric: He has a normal mood and affect. His behavior is normal. Judgment and thought content normal.  Nursing note and vitals reviewed.   ED Course  Procedures (including critical care time) Initial Concerns: PERC  Negative. Atypical for coronary pain. Low risk factor for coronary artery disease  Medications  aspirin chewable tablet 324 mg (324 mg Oral Given 03/30/15 1620)  morphine 4 MG/ML injection 4 mg (4 mg Intravenous Given 03/30/15 1622)  ondansetron (ZOFRAN) injection 4 mg (4 mg Intravenous Given 03/30/15 1620)    Patient Vitals for the past 24 hrs:  BP Temp Temp src Pulse Resp SpO2 Height Weight  03/30/15 1700 116/76 mmHg - - (!) 59 17 100 % - -  03/30/15 1633 117/82 mmHg - - (!) 59 19 100 % - -  03/30/15 1630 117/82 mmHg - - 69 13 100 % - -  03/30/15 1535 133/90 mmHg 99 F (37.2 C) Oral (!) 58 16 100 %  (1.981 m) 200 lb (90.719 kg)    7:14 PM Reevaluation with update and discussion. After initial assessment and treatment, an updated evaluation reveals he  continues to feel comfortable, states the chest pain has resolved completely. He has no further complaints. Findings discussed with the patient and all questions were answered. Iolanda Folson L    Labs Review Labs Reviewed  BASIC METABOLIC PANEL - Abnormal; Notable for the following:    Chloride 100 (*)    All other components within normal limits  CBC  TROPONIN I    Imaging Review Dg Chest 2 View  03/30/2015  CLINICAL DATA:  Chest pain starting 2 hours ago. EXAM: CHEST  2 VIEW COMPARISON:  None. FINDINGS: Cardiomediastinal silhouette is normal in size and configuration. Lungs are clear. Lung volumes are normal. No evidence of pneumonia. No pleural effusion. No pneumothorax. Osseous and soft tissue structures about the chest are unremarkable. IMPRESSION: Normal chest x-ray. Electronically Signed   By: Bary Richard M.D.   On: 03/30/2015 15:47   I have personally reviewed and evaluated these images and lab results as part of my medical decision-making.   EKG Interpretation   Date/Time:  Wednesday March 30 2015 18:16:42 EST Ventricular Rate:  57 PR Interval:  159 QRS Duration: 116 QT Interval:  474 QTC Calculation: 461 R Axis:   87 Text Interpretation:  Unknown rhythm, irregular rate Nonspecific  intraventricular conduction delay Abnrm T, consider ischemia,  anterolateral lds ST elev, probable normal early repol pattern Since last  tracing of earlier today No significant change was found Confirmed by  [facility]  MD, Zimir Kittleson ([id number]) on 03/30/2015 7:14:23 PM      MDM   Final diagnoses:  Nonspecific chest pain  Abnormal EKG    Nonspecific chest pain with abnormal EKG. QT is very minimally prolonged. UA inversion anterior chest leads, without ST abnormality , or Q waves. He has social stressors, but is stable from a psychiatric perspective.  Nursing Notes Reviewed/ Care Coordinated Applicable Imaging Reviewed Interpretation of Laboratory Data incorporated into ED treatment  The  patient appears reasonably screened and/or stabilized for discharge and I doubt any other medical condition or other [facility] requiring further screening, evaluation, or treatment in the ED at this time prior to discharge.  Plan: Home Medications- none; Home Treatments- rest; return here if the recommended treatment, does not improve the symptoms; Recommended follow up- PCP asap, discuss abnormal EKG    Mancel Bale, MD 03/30/15 [phone]

## 2015-03-30 NOTE — ED Notes (Signed)
EKG given to Dr Pickering 

## 2015-03-30 NOTE — ED Notes (Signed)
Patient complaining of chest pain starting approximately 2 hours ago. Patient states "I'm having a lot of stress in my life right now."

## 2015-04-19 ENCOUNTER — Emergency Department (HOSPITAL_COMMUNITY)
Admission: EM | Admit: 2015-04-19 | Discharge: 2015-04-19 | Disposition: A | Discharge status: Other Acute Inpatient Hospital | Payer: Self-pay | Attending: Emergency Medicine | Admitting: Emergency Medicine

## 2015-04-19 ENCOUNTER — Encounter (HOSPITAL_COMMUNITY): Payer: Self-pay | Admitting: Emergency Medicine

## 2015-04-19 ENCOUNTER — Inpatient Hospital Stay (HOSPITAL_COMMUNITY)
Admission: AD | Admit: 2015-04-19 | Discharge: 2015-04-25 | DRG: 897 | Disposition: A | Discharge status: Home / Self Care | Payer: No Typology Code available for payment source | Source: Intra-hospital | Attending: Psychiatry | Admitting: Psychiatry

## 2015-04-19 ENCOUNTER — Encounter (HOSPITAL_COMMUNITY): Payer: Self-pay | Admitting: Behavioral Health

## 2015-04-19 DIAGNOSIS — F1920 Other psychoactive substance dependence, uncomplicated: Secondary | ICD-10-CM | POA: Diagnosis not present

## 2015-04-19 DIAGNOSIS — F1994 Other psychoactive substance use, unspecified with psychoactive substance-induced mood disorder: Secondary | ICD-10-CM | POA: Diagnosis present

## 2015-04-19 DIAGNOSIS — F1910 Other psychoactive substance abuse, uncomplicated: Secondary | ICD-10-CM

## 2015-04-19 DIAGNOSIS — F1124 Opioid dependence with opioid-induced mood disorder: Principal | ICD-10-CM | POA: Diagnosis present

## 2015-04-19 DIAGNOSIS — F1410 Cocaine abuse, uncomplicated: Secondary | ICD-10-CM | POA: Insufficient documentation

## 2015-04-19 DIAGNOSIS — F17210 Nicotine dependence, cigarettes, uncomplicated: Secondary | ICD-10-CM | POA: Insufficient documentation

## 2015-04-19 DIAGNOSIS — F39 Unspecified mood [affective] disorder: Secondary | ICD-10-CM | POA: Diagnosis present

## 2015-04-19 DIAGNOSIS — F1110 Opioid abuse, uncomplicated: Secondary | ICD-10-CM | POA: Insufficient documentation

## 2015-04-19 DIAGNOSIS — R45850 Homicidal ideations: Secondary | ICD-10-CM | POA: Insufficient documentation

## 2015-04-19 HISTORY — DX: Opioid abuse, uncomplicated: F11.10

## 2015-04-19 LAB — CBC
HEMATOCRIT: 40.3 % (ref 39.0–52.0)
HEMOGLOBIN: 13.7 g/dL (ref 13.0–17.0)
MCH: 30 pg (ref 26.0–34.0)
MCHC: 34 g/dL (ref 30.0–36.0)
MCV: 88.2 fL (ref 78.0–100.0)
Platelets: 215 10*3/uL (ref 150–400)
RBC: 4.57 MIL/uL (ref 4.22–5.81)
RDW: 13.4 % (ref 11.5–15.5)
WBC: 4.9 10*3/uL (ref 4.0–10.5)

## 2015-04-19 LAB — RAPID URINE DRUG SCREEN, HOSP PERFORMED
Amphetamines: POSITIVE — AB
BARBITURATES: NOT DETECTED
BENZODIAZEPINES: POSITIVE — AB
Cocaine: POSITIVE — AB
Opiates: NOT DETECTED
Tetrahydrocannabinol: POSITIVE — AB

## 2015-04-19 LAB — BASIC METABOLIC PANEL
ANION GAP: 7 (ref 5–15)
BUN: 10 mg/dL (ref 6–20)
CHLORIDE: 103 mmol/L (ref 101–111)
CO2: 27 mmol/L (ref 22–32)
Calcium: 9.1 mg/dL (ref 8.9–10.3)
Creatinine, Ser: 0.86 mg/dL (ref 0.61–1.24)
GFR calc non Af Amer: >60 mL/min (ref >60)
GLUCOSE: 110 mg/dL — AB (ref 65–99)
POTASSIUM: 4.2 mmol/L (ref 3.5–5.1)
Sodium: 137 mmol/L (ref 135–145)

## 2015-04-19 LAB — ACETAMINOPHEN LEVEL: Acetaminophen (Tylenol), Serum: <10 ug/mL — ABNORMAL LOW (ref 10–30)

## 2015-04-19 LAB — ETHANOL: ALCOHOL ETHYL (B): 5 mg/dL — AB (ref <5)

## 2015-04-19 LAB — SALICYLATE LEVEL

## 2015-04-19 MED ORDER — LOPERAMIDE HCL 2 MG PO CAPS: 2.0000 mg | ORAL_CAPSULE | ORAL | Status: AC | PRN

## 2015-04-19 MED ORDER — CLONIDINE HCL 0.1 MG PO TABS
0.1000 mg | ORAL_TABLET | ORAL | Status: AC
  Administered 2015-04-22 – 2015-04-23 (×3): 0.1 mg via ORAL
  Filled 2015-04-19 (×4): qty 1

## 2015-04-19 MED ORDER — LORAZEPAM 1 MG PO TABS: 1.0000 mg | ORAL_TABLET | Freq: Four times a day (QID) | ORAL | Status: DC | PRN

## 2015-04-19 MED ORDER — CLONIDINE HCL 0.1 MG PO TABS
0.1000 mg | ORAL_TABLET | Freq: Every day | ORAL | Status: DC
  Administered 2015-04-25: 0.1 mg via ORAL
  Filled 2015-04-19 (×2): qty 1

## 2015-04-19 MED ORDER — NAPROXEN 500 MG PO TABS
500.0000 mg | ORAL_TABLET | Freq: Two times a day (BID) | ORAL | Status: AC | PRN
  Administered 2015-04-22: 500 mg via ORAL
  Filled 2015-04-19: qty 1

## 2015-04-19 MED ORDER — CLONIDINE HCL 0.1 MG PO TABS
0.1000 mg | ORAL_TABLET | Freq: Four times a day (QID) | ORAL | Status: AC
  Administered 2015-04-19 – 2015-04-22 (×4): 0.1 mg via ORAL
  Filled 2015-04-19 (×12): qty 1

## 2015-04-19 MED ORDER — ALUM & MAG HYDROXIDE-SIMETH 200-200-20 MG/5ML PO SUSP: 30.0000 mL | ORAL | Status: DC | PRN

## 2015-04-19 MED ORDER — TRAZODONE HCL 50 MG PO TABS
50.0000 mg | ORAL_TABLET | Freq: Every evening | ORAL | Status: DC | PRN
  Administered 2015-04-19 – 2015-04-22 (×2): 50 mg via ORAL
  Filled 2015-04-19 (×3): qty 1
  Filled 2015-04-19: qty 14
  Filled 2015-04-19 (×7): qty 1
  Filled 2015-04-19 (×2): qty 14
  Filled 2015-04-19 (×2): qty 1
  Filled 2015-04-19: qty 14
  Filled 2015-04-19: qty 1

## 2015-04-19 MED ORDER — NICOTINE 21 MG/24HR TD PT24
21.0000 mg | MEDICATED_PATCH | Freq: Every day | TRANSDERMAL | Status: DC
  Administered 2015-04-19 – 2015-04-25 (×7): 21 mg via TRANSDERMAL
  Filled 2015-04-19 (×12): qty 1

## 2015-04-19 MED ORDER — ACETAMINOPHEN 325 MG PO TABS
650.0000 mg | ORAL_TABLET | Freq: Four times a day (QID) | ORAL | Status: DC | PRN
  Administered 2015-04-21 – 2015-04-25 (×3): 650 mg via ORAL
  Filled 2015-04-19 (×3): qty 2

## 2015-04-19 MED ORDER — HYDROXYZINE HCL 25 MG PO TABS
25.0000 mg | ORAL_TABLET | Freq: Four times a day (QID) | ORAL | Status: AC | PRN
  Administered 2015-04-20 – 2015-04-23 (×3): 25 mg via ORAL
  Filled 2015-04-19 (×3): qty 1
  Filled 2015-04-19: qty 10

## 2015-04-19 MED ORDER — METHOCARBAMOL 500 MG PO TABS: 500.0000 mg | ORAL_TABLET | Freq: Three times a day (TID) | ORAL | Status: AC | PRN

## 2015-04-19 MED ORDER — MAGNESIUM HYDROXIDE 400 MG/5ML PO SUSP: 30.0000 mL | Freq: Every day | ORAL | Status: DC | PRN

## 2015-04-19 MED ORDER — DICYCLOMINE HCL 20 MG PO TABS: 20.0000 mg | ORAL_TABLET | Freq: Four times a day (QID) | ORAL | Status: AC | PRN

## 2015-04-19 MED ORDER — ONDANSETRON 4 MG PO TBDP: 4.0000 mg | ORAL_TABLET | Freq: Four times a day (QID) | ORAL | Status: AC | PRN

## 2015-04-19 NOTE — ED Notes (Signed)
Pt's wife upset with decision to d/c. Pt's wife is concerned with the safety of her children. Dr. Jodi Mourning notified.
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
## 2015-04-19 NOTE — Tx Team (Signed)
Initial Interdisciplinary Treatment Plan   PATIENT STRESSORS: Financial difficulties Health problems Medication change or noncompliance Substance abuse   PATIENT STRENGTHS: Ability for insight Average or above average intelligence Communication skills Supportive family/friends   PROBLEM LIST: Problem List/Patient Goals Date to be addressed Date deferred Reason deferred Estimated date of resolution  "get sleep" 04/19/15           "get on medications" 04/19/15     Substance Abuse 04/19/15     Anxiety 04/19/15                              DISCHARGE CRITERIA:  Ability to meet basic life and health needs Adequate post-discharge living arrangements Motivation to continue treatment in a less acute level of care Verbal commitment to aftercare and medication compliance  PRELIMINARY DISCHARGE PLAN: Attend aftercare/continuing care group Outpatient therapy Return to previous living arrangement  PATIENT/FAMIILY INVOLVEMENT: This treatment plan has been presented to and reviewed with the patient, [name].  The patient and family have been given the opportunity to ask questions and make suggestions.  Mickie Bail 04/19/2015, 6:48 PM

## 2015-04-19 NOTE — ED Notes (Signed)
Spoke with Paige with [facility]. Pt has been recommended to discharge with follow-up to [facility]. States she will inform Dr. Jodi Mourning and will fax over resources.

## 2015-04-19 NOTE — ED Notes (Signed)
Report given to Heather, RN at [facility]. 

## 2015-04-19 NOTE — ED Notes (Signed)
PT states he needs to be evaluated d/t anger outbursts and hyperactive moods. PT states he stopped taking his Invega medication this past august. PT states occasional cocaine use and marijuana but denies need for detox. PT denies any SI/HI.

## 2015-04-19 NOTE — [facility] Group Notes (Signed)
Pt attended AA meeting.  Sharbel Sahagun, MHT 

## 2015-04-19 NOTE — ED Notes (Signed)
Pt finished TTS assessment 

## 2015-04-19 NOTE — ED Notes (Signed)
Pt reports a long history of drug abuse, including narcotics, cocaine, and marijuana. Pt states that a few years ago he began going to a suboxone clinic. He was given suboxone and adderall. Pt states that last year he was prescribed Invega for his mood and abruptly stopped taking it in August. States that ever since then he has been dealing with an influx of emotions. States that sometimes he gets very angry. Pt reports not knowing how to deal with his moods. Pt states that he quit going to suboxone clinic due to lack of funds. Pt has been taking his wife's suboxone. Pt denies any HI or SI States he took adderall, suboxone, and smoked marijuana this morning. States he has used cocaine in the past week.

## 2015-04-19 NOTE — Progress Notes (Signed)
Pt accepted to [facility] bed 507-1 to Dr Elna Breslow. Report can be called at [phone]. Pt can arrive anytime.  Ilean Skill, MSW, LCSW Clinical Social Work, Disposition  04/19/2015 [phone]

## 2015-04-19 NOTE — ED Notes (Addendum)
[facility] here to serve IVC paperwork. Pt remains calm and cooperative.

## 2015-04-19 NOTE — ED Notes (Signed)
Pt changed into paper scrubs. Belongings placed into bags and placed into locker. Some belongings sent home with pt's wife.

## 2015-04-19 NOTE — Discharge Instructions (Signed)
Substance Abuse Treatment Programs ° °Intensive Outpatient Programs °[facility]     °[address]      °[place], [place]                   °[phone]      ° °[facility] °[address] °[place], [place] °336-379-7146 ° °[facility]     °(Inpatient and outpatient)     °[address]           °[phone]   ° °[facility] °336-288-1484 (Suboxone and Methadone) ° °[address]      °[place], [place] [address]      °336-882-2125      ° °[address] °[place], [place] °852-3033 ° °[facility] (Outpatient/Inpatient, Chemical)    °(insurance only) [phone]      °       °[facility] (Groups & Residential) °[place], [place] °336-389-1413 ° °   °[facility]     °[address]     °[place], [place]      °[phone]      ° °[facility] (for caregivers and family) °[address] °[place], [place] °336-299-4655 ° ° ° ° ° °Residential Treatment Programs °[facility]      °[address], [place], [place] [address]  °([phone]      ° °[facility] °[address] [place], [place] [address] °[phone] ° °[facility]        °336-248-8914      ° °[facility] °1-800-659-3381 ° °[facility]             °[address]                                         °[place], [place]                                                °877-615-2722 or 336-784-9470                              ° °[facility] [place] °[address] °[place], [address] °1.877.941.8954 ° °[facility]    °[address]      °[place], [place]     °[phone]      ° °[facility] °[address] °[place], [place] °336-285-9073 ° °[facility]   °[address]     °[place], [place] [address]     °[phone]      °Admissions: 8am-3pm M-F ° °[facility] °[address] °[place],  [place] °336-227-7417 ° °[facility]: Residential Program (90 Days)   °[place], [place]      °336-725-8389 or 800-758-6077    ° °[facility] °[place], [place] °(Walk in Hours over the weekend or by referral) ° °[facility] °[address], [place],  [address] °(336) [phone] ° °Crisis Mobile: [facility]:  1-877-626-1772 (for crisis response 24 hours a day) °[facility] Hotline:      1-800-256-2452 °Outpatient Psychiatry and Counseling ° °[facility]: Mobile Crisis   Management 24 hours:  1-877-626-1772 ° °[facility] sliding scale fee and walk in schedule: M-F 8am-12pm/1pm-3pm °[address]  °[place], [place] [address] °[phone] ° °[facility] °[address] °[place], [place] [address] °336-703-9650 ° °[facility] ([facility] known [facility])- new patient walk-in appointments available Monday - Friday 8am -3pm.          °[address] °[place], [place] [address] °336-676-6840 or crisis line- 336-676-6905 ° °[facility]/ Intensive Outpatient Therapy Program °[address] °[place], [place] [address] °336-832-9804 ° °[facility]                  °Crisis Services      °[phone]      °[address]     °[place], [place] [address]                ° °[facility]   °[facility] °[phone] °[address] °[place], [place] [address] ° ° °[facility]          °[address]  °[place], [place] [address]       °(336) [phone] ° °[facility] °[address] °[place], [place] [address] °336-292-1510 ° °[facility]    °[address]    °[place], [place] [address]     °336-632-3505      ° °Parish McKinney, MD     °[address]     °[place] [address]     °336-282-1251     °  °[facility] °[address] °[place] [address] ° °[facility]     °[address]     °[place], [place]      °336-542-2076      ° °[facility] °Shamsher Ahluwalia, MD °[address] °[place], [place] [address] °[phone] ° °[facility]     °[address]     °[place], [place] [address]     °336-274-1237      ° °[facility] °[address] °[place], [place] [address] °336-854-4450 °Resources for Temporary Residential Assistance/Crisis Centers ° °DAY CENTERS °[facility] °M-F 8am-3pm   °[address] [place], [place] [address]   336-332-0824 °Services include: laundry, barbering, support groups, case management, phone  & computer access, showers, AA/NA mtgs, mental health/substance abuse nurse, job skills class, disability information, VA assistance, spiritual classes, etc.  ° °HOMELESS SHELTERS ° °[facility]     °[facility]   °[address], [place]     °336.271.5959       °       °[facility] (women and children)       °[address] °[place], [place] [address] °336-275-0820 °[email or web address] for application and process °Application Required ° °[facility]   °[address]    °[place] [address]     °[phone]       °             °[facility] °[address] °, [place] [address] °336.273.5572 °336-235-0363(schedule application appt.) °Application Required ° °[facility] (women only)    °[address]     °[place], [place] [address]     °336-884-1039      °  Intake starts 6pm daily °Need valid ID, SSC, & Police report °[facility] [place] °[address] °[place], [place] °336-881-5420 °Application Required ° °[facility] (men only)     °[address]      °[place], [place]     °[phone]      ° °[facility] °(Pregnant women only) °[address] °[place], [place] °336-275-0206 ° °[facility]      °[address]      °[place], [place] [address]     °336-722-9951      °       °[facility] °[address] °[place], [place] °336-723-1848 °90 day commitment/SA/Application process ° °[facility](men only)     °[address]     °[place], [place]     °[phone]       °Check-in at 7pm     °       °[facility] °[address] °[place], [place] [address] °336-248-6684 °Men/Women/Women and Children must be there by 7 pm ° °[facility] °[place],  °336-722-8721                ° °

## 2015-04-19 NOTE — ED Notes (Signed)
Dr Zavitz at bedside  
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
## 2015-04-19 NOTE — ED Notes (Signed)
[name] with [facility] is speaking with wife at this time.

## 2015-04-19 NOTE — ED Notes (Signed)
Phlebotomy at bedside.
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
## 2015-04-19 NOTE — ED Notes (Signed)
TTS consult in progress at this time. 

## 2015-04-20 ENCOUNTER — Encounter (HOSPITAL_COMMUNITY): Payer: Self-pay | Admitting: Psychiatry

## 2015-04-20 DIAGNOSIS — F1994 Other psychoactive substance use, unspecified with psychoactive substance-induced mood disorder: Secondary | ICD-10-CM

## 2015-04-20 DIAGNOSIS — F1920 Other psychoactive substance dependence, uncomplicated: Secondary | ICD-10-CM

## 2015-04-20 HISTORY — DX: Other psychoactive substance dependence, uncomplicated: F19.20

## 2015-04-20 HISTORY — DX: Other psychoactive substance use, unspecified with psychoactive substance-induced mood disorder: F19.94

## 2015-04-20 MED ORDER — CARBAMAZEPINE ER 100 MG PO TB12
100.0000 mg | ORAL_TABLET | Freq: Two times a day (BID) | ORAL | Status: DC
  Administered 2015-04-20 – 2015-04-21 (×2): 100 mg via ORAL
  Filled 2015-04-20 (×5): qty 1

## 2015-04-20 MED ORDER — LORAZEPAM 1 MG PO TABS
1.0000 mg | ORAL_TABLET | Freq: Four times a day (QID) | ORAL | Status: DC | PRN
  Administered 2015-04-20 – 2015-04-23 (×5): 1 mg via ORAL
  Filled 2015-04-20 (×5): qty 1

## 2015-04-20 MED ORDER — OLANZAPINE 5 MG PO TBDP
5.0000 mg | ORAL_TABLET | Freq: Every day | ORAL | Status: DC
  Administered 2015-04-20 – 2015-04-24 (×5): 5 mg via ORAL
  Filled 2015-04-20 (×2): qty 7
  Filled 2015-04-20 (×5): qty 1
  Filled 2015-04-20: qty 7
  Filled 2015-04-20: qty 1

## 2015-04-20 NOTE — [facility] Group Notes (Signed)
[facility] LCSW Aftercare Discharge Planning Group Note   04/20/2015 10:25 AM  Participation Quality:  Appropriate  Mood/Affect:  Irritable  Depression Rating:  5  Anxiety Rating:  5  Thoughts of Suicide:  No Will you contract for safety?   NA  Current AVH:  No  Plan for Discharge/Comments:  Pt reports that he wants to d/c home and "I want to do things my way." Pt reports that he is not able to afford suboxone and xanax and has been abusing these substances to self medicate. Pt reports no current providers and did not want to provide any other information.   Transportation Means: unknown at this time   Supports: wife; limited support  Smart, Conservation officer, nature

## 2015-04-20 NOTE — [facility] Group Notes (Signed)
[facility] LCSW Group Therapy  04/20/2015 3:44 PM  Type of Therapy:  Group Therapy  Participation Level:  Did Not Attend-invited. Chose to remain in bed.   Summary of Progress/Problems: Feelings around Diagnosis.   Smart, Murriel Eidem LCSW 04/20/2015, 3:44 PM
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
## 2015-04-20 NOTE — Progress Notes (Signed)
Pt attended NA group meeting this evening.   

## 2015-04-20 NOTE — Progress Notes (Signed)
D:Patient in the hallway on approach.  Patient states he is ready to be discharged.  Patient states he only wants to take medications for anxiety and medications for sleep. Patient states, "I just want to get my mind right." Patient denies SI/HI and denies AVH.   A: Staff to monitor Q 15 mins for safety.  Encouragement and support offered.  Scheduled medications administered per orders. Vistaril administered prn for anxiety. R: Patient remains safe on the unit.  Patient attended group tonight.  Patient visible on the unit and interacting with peers.  Patient taking administered medications.

## 2015-04-20 NOTE — Progress Notes (Signed)
Recreation Therapy Notes  Date: 03.01.2017 Time: 9:30am Location: [address] Group Room   Group Topic: Stress Management  Goal Area(s) Addresses:  Patient will actively participate in stress management techniques presented during session.   Behavioral Response: Did not attend.   Siah Kannan L Pattrick Bady, LRT/CTRS        Heber Hoog L 04/20/2015 11:57 AM 
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
## 2015-04-20 NOTE — BHH Suicide Risk Assessment (Signed)
[facility] Admission Suicide Risk Assessment   Nursing information obtained from:    Demographic factors:    Current Mental Status:    Loss Factors:    Historical Factors:    Risk Reduction Factors:     Total Time spent with patient: 45 minutes Principal Problem: Substance induced mood disorder (HCC) Diagnosis:   Patient Active Problem List   Diagnosis Date Noted  . Polysubstance (including opioids) dependence w/o physiol dependence (HCC) [F19.20] 04/20/2015  . Substance induced mood disorder (HCC) [F19.94] 04/20/2015  . Mood disorder (HCC) [F39] 04/19/2015   Subjective Data: see admission H and P  Continued Clinical Symptoms:  Alcohol Use Disorder Identification Test Final Score (AUDIT): 2 The "Alcohol Use Disorders Identification Test", Guidelines for Use in Primary Care, Second Edition.  World Science writer [employer]). Score between 0-7:  no or low risk or alcohol related problems. Score between 8-15:  moderate risk of alcohol related problems. Score between 16-19:  high risk of alcohol related problems. Score 20 or above:  warrants further diagnostic evaluation for alcohol dependence and treatment.   CLINICAL FACTORS:   Severe Anxiety and/or Agitation Alcohol/Substance Abuse/Dependencies Chronic Pain   Psychiatric Specialty Exam: ROS  Blood pressure 125/77, pulse 79, temperature 97.5 F (36.4 C), temperature source Oral, resp. rate 16, height  (1.981 m), weight 78.926 kg (174 lb), SpO2 100 %.Body mass index is 20.11 kg/(m^2).   COGNITIVE FEATURES THAT CONTRIBUTE TO RISK:  Closed-mindedness, Polarized thinking and Thought constriction (tunnel vision)    SUICIDE RISK:   Moderate:  Frequent suicidal ideation with limited intensity, and duration, some specificity in terms of plans, no associated intent, good self-control, limited dysphoria/symptomatology, some risk factors present, and identifiable protective factors, including available and accessible social  support.  PLAN OF CARE: see admission H and P  I certify that inpatient services furnished can reasonably be expected to improve the patient's condition.   Rachael Fee, MD 04/20/2015, 4:34 PM

## 2015-04-20 NOTE — Progress Notes (Signed)
DAR NOTE: Pt present with flat affect and depressed mood in the unit. Pt has been isolating himself. Pt denies physical pain, took all his meds as scheduled. As per self inventory, pt had a good night sleep, good appetite, normal energy, and good concentration. Pt rate depression at 3, hopeless ness at 5, and anxiety at 5. Pt stated his goal for day is " getting out of here and getting in a out patient program. Pt's safety ensured with 15 minute and environmental checks. Pt currently denies SI/HI and A/V hallucinations. Pt verbally agrees to seek staff if SI/HI or A/VH occurs and to consult with staff before acting on these thoughts. Will continue POC.
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
## 2015-04-21 MED ORDER — CARBAMAZEPINE ER 200 MG PO TB12
200.0000 mg | ORAL_TABLET | Freq: Two times a day (BID) | ORAL | Status: DC
  Administered 2015-04-21 – 2015-04-25 (×8): 200 mg via ORAL
  Filled 2015-04-21 (×2): qty 1
  Filled 2015-04-21: qty 14
  Filled 2015-04-21: qty 1
  Filled 2015-04-21 (×4): qty 14
  Filled 2015-04-21 (×3): qty 1
  Filled 2015-04-21: qty 14
  Filled 2015-04-21 (×2): qty 1
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
## 2015-04-21 NOTE — Progress Notes (Signed)
[facility] Group Notes:  (Nursing/MHT/Case Management/Adjunct)  Date:  04/21/2015  Time:  2100  Type of Therapy:  wrap up group  Participation Level:  Active  Participation Quality:  Intrusive, Monopolizing, Redirectable and Sharing  Affect:  Irritable and Labile  Cognitive:  Appropriate  Insight:  Lacking  Engagement in Group:  Limited  Modes of Intervention:  Clarification, Education and Support  Summary of Progress/Problems:Pt shares that he was restarted on mood leveling medication and plans to get a sponsor and attend 90 meetings in 90 days. Pt wants to go home to either his mom or wife and states "my anxiety is going to keep going up and up and up if I am kept in this place."  Pt anger increases as he continues to speak about being committed but pt keeps his cool and leaves group early.   Marcille Buffy 04/21/2015, 10:12 PM

## 2015-04-21 NOTE — [facility] Group Notes (Signed)
Patient was invited to attend nursing education group however elected to remain in bed.  

## 2015-04-21 NOTE — [facility] Suicide Risk Assessment (Signed)
[facility] INPATIENT:  Family/Significant Other Suicide Prevention Education  Suicide Prevention Education:  Contact Attempts: Chase Caller (pt's stepfather) [phone] has been identified by the patient as the family member/significant other with whom the patient will be residing, and identified as the person(s) who will aid the patient in the event of a mental health crisis.  With written consent from the patient, two attempts were made to provide suicide prevention education, prior to and/or following the patient's discharge.  We were unsuccessful in providing suicide prevention education.  A suicide education pamphlet was given to the patient to share with family/significant other.  Date and time of first attempt: 04/21/15 at 11:00AM (this is his work number-no message left with reception. Will try again later).   Smart, Yomar Mejorado LCSW 04/21/2015, 11:00 AM   Treshun Wold (pt's wife) [phone] has been identified by the patient as the family member/significant other with whom the patient will be residing, and identified as the person(s) who will aid the patient in the event of a mental health crisis.  With written consent from the patient, two attempts were made to provide suicide prevention education, prior to and/or following the patient's discharge.  We were unsuccessful in providing suicide prevention education.  A suicide education pamphlet was given to the patient to share with family/significant other.  Trula Slade, MSW, LCSW Clinical Social Worker 04/21/2015 12:58 PM

## 2015-04-21 NOTE — Progress Notes (Signed)
Patient resting in bed. Required prompting and encouragement to come up for meds and assessment. Patient states, "I'm not withdrawing so I don't need any meds. I will take my patch though." Rates his depression, hopelessness and anxiety all at a 5/10. Complaining of chronic back pain of a 4/10 but states, "it's not anything I'm not used to. I don't need anything for it." States his goal is to "get out of here and get to a 30 day program." Patient offered med education particularly regarding reasoning for tegretol order. Encouraged patient to take it to assist in his mood lability. Emotional support offered. Self inventory reviewed. Patient still refusing meds stating, "I feel good. I don't need that." Patient forwarding minimal information, guarded. Denies SI/HI and remains safe on level III obs. Lawrence [place]

## 2015-04-21 NOTE — [facility] Group Notes (Signed)
[facility] LCSW Group Therapy  04/21/2015 12:43 PM  Type of Therapy:  Group Therapy  Participation Level:  Did Not Attend-pt chose to remain in bed.   Modes of Intervention:  Confrontation, Discussion, Education, Exploration, Problem-solving, Rapport Building, Socialization and Support  Summary of Progress/Problems: Emotion Regulation: This group focused on both positive and negative emotion identification and allowed group members to process ways to identify feelings, regulate negative emotions, and find healthy ways to manage internal/external emotions. Group members were asked to reflect on a time when their reaction to an emotion led to a negative outcome and explored how alternative responses using emotion regulation would have benefited them. Group members were also asked to discuss a time when emotion regulation was utilized when a negative emotion was experienced.   Smart, Toney Difatta LCSW 04/21/2015, 12:43 PM

## 2015-04-21 NOTE — Plan of Care (Signed)
Problem: Ineffective individual coping Goal: STG: Patient will remain free from self harm Outcome: Progressing Patient has not engaged in self harm.  Problem: Alteration in mood & ability to function due to Goal: STG-Patient will attend groups Outcome: Progressing Patient attending the majority of groups however at times remains in bed.

## 2015-04-22 NOTE — Progress Notes (Signed)
[facility] MD Progress Note  04/22/2015 3:04 PM [name]  MRN:  [id number] Subjective:  [name] is focused on wanting to leave. His wife has communicated to him that she wants him to stay couple of more days to give the medications a chance to work. He has not been able to communicate with his step father but thinks he is avoiding his call as he too wants him to stay here longer. He does not want or see the need to stay. He states he wants to smoke. He is asking to be D/C and he will find someone to pick him up. He has been pacing and gotten to the door with his things packed. He does respond to redirection Principal Problem: Substance induced mood disorder (HCC) Diagnosis:   Patient Active Problem List   Diagnosis Date Noted  . Substance induced mood disorder (HCC) [F19.94] 04/20/2015  . Polysubstance (including opioids) dependence with physiological dependence (HCC) [F19.20] 04/20/2015  . Mood disorder (HCC) [F39] 04/19/2015   Total Time spent with patient: 30 minutes  Past Psychiatric History: see admission H and P  Past Medical History:  Past Medical History  Diagnosis Date  . Back pain   . Opiate abuse, continuous     Past Surgical History  Procedure Laterality Date  . Foot surgery    . Tonsillectomy     Family History: History reviewed. No pertinent family history. Family Psychiatric  History: see admission H and P Social History:  History  Alcohol Use No    Comment: denies     History  Drug Use  . Yes  . Special: Marijuana    Comment: cocaine x1 week ago, marijuana today    Social History   Social History  . Marital Status: Married    Spouse Name: N/A  . Number of Children: N/A  . Years of Education: N/A   Social History Main Topics  . Smoking status: Current Every Day Smoker -- 1.00 packs/day    Types: Cigarettes  . Smokeless tobacco: None  . Alcohol Use: No     Comment: denies  . Drug Use: Yes    Special: Marijuana     Comment: cocaine x1 week ago,  marijuana today  . Sexual Activity: Not Asked   Other Topics Concern  . None   Social History Narrative   Additional Social History:      Name of Substance 1: THC 1 - Age of First Use: 14 1 - Amount (size/oz): 1 bowl 1 - Frequency: daily 1 - Duration: years 1 - Last Use / Amount: 04/19/15 - one bowl Name of Substance 2: xanax 2 - Frequency: "whenever I get anxious" Name of Substance 3: cocaine - powder and crack cocaine 3 - Age of First Use: 18 3 - Frequency: once a month 3 - Last Use / Amount: 04/12/15 - 1 gram Name of Substance 4: adderall 4 - Last Use / Amount: 04/19/2015 Name of Substance 5: opiates - snorted pain pills 5 - Frequency: pt sts used heavily during his 20s  5 - Last Use / Amount: stopped last yr when started going to suboxone clinic          Sleep: Fair  Appetite:  Fair  Current Medications: Current Facility-Administered Medications  Medication Dose Route Frequency Provider Last Rate Last Dose  . acetaminophen (TYLENOL) tablet 650 mg  650 mg Oral Q6H PRN Oneta Rack, NP   650 mg at 04/21/15 1948  . alum & mag hydroxide-simeth (MAALOX/MYLANTA)  200-200-20 MG/5ML suspension 30 mL  30 mL Oral Q4H PRN Oneta Rack, NP      . carbamazepine (TEGRETOL XR) 12 hr tablet 200 mg  200 mg Oral BID Rachael Fee, MD   200 mg at 04/22/15 1610  . cloNIDine (CATAPRES) tablet 0.1 mg  0.1 mg Oral BH-qamhs Kerry Hough, PA-C       Followed by  . [START ON 04/25/2015] cloNIDine (CATAPRES) tablet 0.1 mg  0.1 mg Oral QAC breakfast Kerry Hough, PA-C      . dicyclomine (BENTYL) tablet 20 mg  20 mg Oral Q6H PRN Kerry Hough, PA-C      . hydrOXYzine (ATARAX/VISTARIL) tablet 25 mg  25 mg Oral Q6H PRN Kerry Hough, PA-C   25 mg at 04/21/15 2104  . loperamide (IMODIUM) capsule 2-4 mg  2-4 mg Oral PRN Kerry Hough, PA-C      . LORazepam (ATIVAN) tablet 1 mg  1 mg Oral Q6H PRN Rachael Fee, MD   1 mg at 04/21/15 1252  . magnesium hydroxide (MILK OF MAGNESIA)  suspension 30 mL  30 mL Oral Daily PRN Oneta Rack, NP      . methocarbamol (ROBAXIN) tablet 500 mg  500 mg Oral Q8H PRN Kerry Hough, PA-C      . naproxen (NAPROSYN) tablet 500 mg  500 mg Oral BID PRN Kerry Hough, PA-C      . nicotine (NICODERM CQ - dosed in mg/24 hours) patch 21 mg  21 mg Transdermal Daily Rachael Fee, MD   21 mg at 04/22/15 0830  . OLANZapine zydis (ZYPREXA) disintegrating tablet 5 mg  5 mg Oral QHS Rachael Fee, MD   5 mg at 04/21/15 2105  . ondansetron (ZOFRAN-ODT) disintegrating tablet 4 mg  4 mg Oral Q6H PRN Kerry Hough, PA-C      . traZODone (DESYREL) tablet 50 mg  50 mg Oral QHS,MR X 1 Kerry Hough, PA-C   50 mg at 04/19/15 2109    Lab Results: No results found for this or any previous visit (from the past 48 hour(s)).  Blood Alcohol level:  Lab Results  Component Value Date   ETH 5* 04/19/2015    Physical Findings: AIMS:  , ,  ,  ,    CIWA:    COWS:  COWS Total Score: 1  Musculoskeletal: Strength & Muscle Tone: within normal limits Gait & Station: normal Patient leans: normal  Psychiatric Specialty Exam: Review of Systems  Constitutional: Negative.   HENT: Negative.   Eyes: Negative.   Respiratory: Negative.   Cardiovascular: Negative.   Gastrointestinal: Negative.   Genitourinary: Negative.   Musculoskeletal: Negative.   Skin: Negative.   Neurological: Negative.   Endo/Heme/Allergies: Negative.   Psychiatric/Behavioral: Positive for substance abuse. The patient is nervous/anxious.     Blood pressure 128/72, pulse 78, temperature 97.3 F (36.3 C), temperature source Oral, resp. rate 16, height  (1.981 m), weight 78.926 kg (174 lb), SpO2 100 %.Body mass index is 20.11 kg/(m^2).  General Appearance: Fairly Groomed  Patent attorney::  Minimal  Speech:  Clear and Coherent  Volume:  fluctuates  Mood:  Anxious, Dysphoric and Irritable  Affect:  Labile and anxious worried  Thought Process:  Coherent and Goal Directed   Orientation:  Full (Time, Place, and Person)  Thought Content:  his wanting to leave  Suicidal Thoughts:  No  Homicidal Thoughts:  No  Memory:  Immediate;  Fair Recent;   Fair Remote;   Fair  Judgement:  Impaired  Insight:  Lacking  Psychomotor Activity:  Restlessness  Concentration:  Fair  Recall:  [employer]  Fund of Knowledge:Fair  Language: Fair  Akathisia:  No  Handed:  Right  AIMS (if indicated):     Assets:  Housing Social Support  ADL's:  Intact  Cognition: WNL  Sleep:  Number of Hours: 6   Treatment Plan Summary: Daily contact with patient to assess and evaluate symptoms and progress in treatment and Medication management Supportive approach/coping skills/stress management  Polysubstance dependence; continue the clonidine detox protocol Mood instability; continue the Tegretol/Zyprexa combination and optimize response  Work with CBT/mindfulness Tomisha Reppucci A, MD 04/22/2015, 3:04 PM

## 2015-04-22 NOTE — [facility] Group Notes (Addendum)
[facility] LCSW Aftercare Discharge Planning Group Note   04/22/2015 11:07 AM  Participation Quality:  Invited. Chose to remain in bed. CSW and MD met with pt after group to discuss possibility of d/c today. Pt notified that CSW spoke with his wife this morning and she is wanting him to stay another few days to make sure his medications are working well. Pt states that he will call his wife to "get on the same page" and let MD and CSW know what they decide in terms of d/c plan. Follow-up at [place] if insurance is active Nurse, mental health). If not, CSW scheduled pt for appt on Tuesday at [facility]. Both pt and his wife are aware of this. Pt provided with [facility] information to share with his wife.   Smart, Lillyen Schow LCSW

## 2015-04-22 NOTE — Progress Notes (Signed)
Patient attend AA group meeting.  

## 2015-04-22 NOTE — [facility] Group Notes (Signed)
[facility] LCSW Group Therapy  04/22/2015 1:31 PM  Type of Therapy:  Group Therapy  Participation Level:  Did Not Attend-pt walking in and out of group yelling about IVC paperwork and wanting to leave. Pt labile and resistant.   Modes of Intervention:  Confrontation, Discussion, Education, Exploration, Problem-solving, Rapport Building, Socialization and Support  Summary of Progress/Problems: Feelings around Relapse. Group members discussed the meaning of relapse and shared personal stories of relapse, how it affected them and others, and how they perceived themselves during this time. Group members were encouraged to identify triggers, warning signs and coping skills used when facing the possibility of relapse. Social supports were discussed and explored in detail.   Smart, Neve Branscomb LCSW 04/22/2015, 1:31 PM

## 2015-04-22 NOTE — Progress Notes (Signed)
Recreation Therapy Notes  Date: 03.03.2017 Time: 9:30am Location: [address] Dayroom   Group Topic: Stress Management  Goal Area(s) Addresses:  Patient will actively participate in stress management techniques presented during session.   Behavioral Response: Did not attend.   Marykay Lexenise L Dashonna Chagnon, LRT/CTRS  Ruthia Person L 04/22/2015 10:20 AM

## 2015-04-22 NOTE — Progress Notes (Signed)
DAR NOTE: Pt present with flat affect and depressed mood in the unit. Pt has been isolating himself and has been bed most of the time. Pt denies physical pain, took all his meds as scheduled. As per self inventory, pt had a good night sleep, good appetite, normal energy, and good concentration. Pt rate depression at 3, hopeless ness at 3, and anxiety at 3. Pt's goal for today is to" get my own plan and keep it, move on out of here." Pt's safety ensured with 15 minute and environmental checks. Pt currently denies SI/HI and A/V hallucinations. Pt verbally agrees to seek staff if SI/HI or A/VH occurs and to consult with staff before acting on these thoughts. Will continue POC.

## 2015-04-22 NOTE — Progress Notes (Signed)
D: Pt has anxious affect and mood.  Pt reports his day has "been great" and that his goal is to "get out of here."  He reports that he feels safe to discharge tomorrow.  Pt denies SI/HI, denies hallucinations, reports back pain of 5/10.  Pt has been visible in milieu and he attended evening group.   A: Introduced self to pt.  Met with pt and offered support and encouragement.  Actively listened to pt.  Medication education provided.  PRN medication administered for pain and anxiety. R: Pt refused scheduled Trazodone and Clonidine, stating "I only want to take what I took last night" after education was provided.  Pt verbally contracts for safety.  Will continue to monitor and assess.

## 2015-04-23 NOTE — Progress Notes (Signed)
D) Pt has attended the groups and is interacting with his peers. Has been cooperative and interactive. Has attended the groups and participated. States "I was just being an a___hole when I first got here. I am better now. I want to be able to work on me. I don't want to be here, but in some ways I am glad I am here". Rates his depression, hopelessness and helplessness all at a 5 and denies SI and HI. A) Given support, reassurance and praise. Provided with a 1:1.  R) Denies SI and HI. Working on his issues.

## 2015-04-23 NOTE — Progress Notes (Signed)
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 Patient is aware that he has been IVC'd and that he would have to wait until he is stabilized.Patient i is awake, alert and oriented X4.  Denies suicidal or homicidal ideation. Denies auditory or visual hallucination and does not appear to be responding to internal stimuli. Patient reports that he cannot handle detox ing without suboxon.  Patient report l am not attending any group sessions. States his depression 2/10. Patient reports that he unable to rest and has a okay appetite. Patient report he is ready for discharged and states that I will got live with my father.  States that "I was blaming everyone but myself." Support, encouragement and reassurance was provided.    Principal Problem: Substance induced mood disorder (HCC) Diagnosis:   Patient Active Problem List   Diagnosis Date Noted  . Substance induced mood disorder (HCC) [F19.94] 04/20/2015  . Polysubstance (including opioids) dependence with physiological dependence (HCC) [F19.20] 04/20/2015  . Mood disorder (HCC) [F39] 04/19/2015   Total Time spent with patient: 30 minutes  Past Psychiatric History: see admission H and P  Past Medical History:  Past Medical History  Diagnosis Date  . Back pain   . Opiate abuse, continuous     Past Surgical History  Procedure Laterality Date  . Foot surgery    . Tonsillectomy     Family History: History reviewed. No pertinent family history. Family Psychiatric  History: see admission H and P Social History:  History  Alcohol Use No    Comment: denies     History  Drug Use  . Yes  . Special: Marijuana    Comment: cocaine x1 week ago, marijuana today    Social  History   Social History  . Marital Status: Married    Spouse Name: N/A  . Number of Children: N/A  . Years of Education: N/A   Social History Main Topics  . Smoking status: Current Every Day Smoker -- 1.00 packs/day    Types: Cigarettes  . Smokeless tobacco: None  . Alcohol Use: No     Comment: denies  . Drug Use: Yes    Special: Marijuana     Comment: cocaine x1 week ago, marijuana today  . Sexual Activity: Not Asked   Other Topics Concern  . None   Social History Narrative   Additional Social History:      Name of Substance 1: THC 1 - Age of First Use: 14 1 - Amount (size/oz): 1 bowl 1 - Frequency: daily 1 - Duration: years 1 - Last Use / Amount: 04/19/15 - one bowl Name of Substance 2: xanax 2 - Frequency: "whenever I get anxious" Name of Substance 3: cocaine - powder and crack cocaine 3 - Age of First Use: 18 3 - Frequency: once a month 3 - Last Use / Amount: 04/12/15 - 1 gram Name of Substance 4: adderall 4 - Last Use / Amount: 04/19/2015 Name of Substance 5: opiates - snorted pain pills 5 - Frequency: pt sts used heavily during his 20s  5 - Last Use / Amount: [facility] yr [facility]  Sleep: Fair  Appetite:  Fair  Current Medications: Current Facility-Administered Medications  Medication Dose Route Frequency Provider Last Rate Last Dose  . acetaminophen (TYLENOL) tablet 650 mg  650 mg Oral Q6H PRN Oneta Rack, NP   650 mg at 04/21/15 1948  . alum & mag hydroxide-simeth (MAALOX/MYLANTA) 200-200-20 MG/5ML suspension 30 mL  30 mL Oral Q4H PRN Oneta Rack, NP      . carbamazepine (TEGRETOL XR) 12 hr tablet 200 mg  200 mg Oral BID Rachael Fee, MD   200 mg at 04/23/15 0834  . cloNIDine (CATAPRES) tablet 0.1 mg  0.1 mg Oral BH-qamhs Spencer E Simon, PA-C   0.1 mg at 04/23/15 4098   Followed by  . [START ON 04/25/2015] cloNIDine (CATAPRES) tablet 0.1 mg  0.1 mg Oral QAC breakfast Kerry Hough, PA-C      .  dicyclomine (BENTYL) tablet 20 mg  20 mg Oral Q6H PRN Kerry Hough, PA-C      . hydrOXYzine (ATARAX/VISTARIL) tablet 25 mg  25 mg Oral Q6H PRN Kerry Hough, PA-C   25 mg at 04/21/15 2104  . loperamide (IMODIUM) capsule 2-4 mg  2-4 mg Oral PRN Kerry Hough, PA-C      . LORazepam (ATIVAN) tablet 1 mg  1 mg Oral Q6H PRN Rachael Fee, MD   1 mg at 04/23/15 1059  . magnesium hydroxide (MILK OF MAGNESIA) suspension 30 mL  30 mL Oral Daily PRN Oneta Rack, NP      . methocarbamol (ROBAXIN) tablet 500 mg  500 mg Oral Q8H PRN Kerry Hough, PA-C      . naproxen (NAPROSYN) tablet 500 mg  500 mg Oral BID PRN Kerry Hough, PA-C   500 mg at 04/22/15 2219  . nicotine (NICODERM CQ - dosed in mg/24 hours) patch 21 mg  21 mg Transdermal Daily Rachael Fee, MD   21 mg at 04/23/15 0834  . OLANZapine zydis (ZYPREXA) disintegrating tablet 5 mg  5 mg Oral QHS Rachael Fee, MD   5 mg at 04/22/15 2104  . ondansetron (ZOFRAN-ODT) disintegrating tablet 4 mg  4 mg Oral Q6H PRN Kerry Hough, PA-C      . traZODone (DESYREL) tablet 50 mg  50 mg Oral QHS,MR X 1 Kerry Hough, PA-C   50 mg at 04/22/15 2105    Lab Results: No results found for this or any previous visit (from the past 48 hour(s)).  Blood Alcohol level:  Lab Results  Component Value Date   ETH 5* 04/19/2015    Physical Findings: AIMS: Facial and Oral Movements Muscles of Facial Expression: None, normal Lips and Perioral Area: None, normal Jaw: None, normal Tongue: None, normal,Extremity Movements Upper (arms, wrists, hands, fingers): None, normal Lower (legs, knees, ankles, toes): None, normal, Trunk Movements Neck, shoulders, hips: None, normal, Overall Severity Severity of abnormal movements (highest score from questions above): None, normal Incapacitation due to abnormal movements: None, normal Patient's awareness of abnormal movements (rate only patient's report): No Awareness, Dental Status Current problems with teeth  and/or dentures?: No Does patient usually wear dentures?: No  CIWA:    COWS:  COWS Total Score: 8  Musculoskeletal: Strength & Muscle Tone: within normal limits Gait & Station: normal Patient leans: normal  Psychiatric Specialty Exam: Review of Systems  Constitutional: Negative.   HENT: Negative.   Eyes: Negative.   Respiratory: Negative.   Cardiovascular: Negative.   Gastrointestinal: Negative.  Genitourinary: Negative.   Musculoskeletal: Negative.   Skin: Negative.   Neurological: Negative.   Endo/Heme/Allergies: Negative.   Psychiatric/Behavioral: Positive for depression and substance abuse. The patient is nervous/anxious.   All other systems reviewed and are negative.   Blood pressure 142/79, pulse 81, temperature 97.7 F (36.5 C), temperature source Oral, resp. rate 16, height  (1.981 m), weight 78.926 kg (174 lb), SpO2 100 %.Body mass index is 20.11 kg/(m^2).  General Appearance: Fairly Groomed  Patent attorney::  Minimal  Speech:  Clear and Coherent  Volume:  fluctuates  Mood:  Anxious, Dysphoric and Irritable  Affect:  Labile and anxious worried  Thought Process:  Coherent and Goal Directed  Orientation:  Full (Time, Place, and Person)  Thought Content:  his wanting to leave (ongoing)  Suicidal Thoughts:  No  Homicidal Thoughts:  No  Memory:  Immediate;   Fair Recent;   Fair Remote;   Fair  Judgement:  Impaired  Insight:  Lacking  Psychomotor Activity:  Restlessness  Concentration:  Fair  Recall:  [employer] of Knowledge:Fair  Language: Fair  Akathisia:  No  Handed:  Right  AIMS (if indicated):     Assets:  Housing Social Support  ADL's:  Intact  Cognition: WNL  Sleep:  Number of Hours: 5.75    I agree with current treatment plan on 04/23/2015, Patient seen face-to-face for psychiatric evaluation follow-up, chart reviewed and case discussed with the [facility].  Reviewed the information documented and agree with the treatment plan.  Treatment Plan  Summary: Daily contact with patient to assess and evaluate symptoms and progress in treatment and Medication management Supportive approach/coping skills/stress management  Polysubstance dependence; continue the clonidine detox protocol Mood instability; continue the Tegretol/Zyprexa combination and optimize response  Work with CBT/mindfulness  Oneta Rack, NP 04/23/2015, 3:57 PM Agree with NP Progress Note, as above  Nehemiah Massed, MD

## 2015-04-23 NOTE — Progress Notes (Signed)
Pt has been in the dayroom most of the shift. At the beginning of the shift, he reported that he was doing ok, but still feeling anxious.  By the end of the shift, pt came to writer asking for whatever meds he could have as he was beginning to feel the effects of withdrawal.  He was given his scheduled meds at med pass time, but shortly after 10 pm, pt reported that he felt really bad and was developing a headache.  He was given Ativan 1 mg along with Naproxen 500 mg for pain.  Pt told writer that he should have taken the meds he was offered during the day, and maybe he would not feel as bad.  He denies SI/HI/AVH.  He has been cooperative and pleasant with staff this evening.  He makes his needs known to staff.  Support and encouragement offered.  Discharge plans are in process.  Safety maintained with q15 minute checks.

## 2015-04-23 NOTE — Progress Notes (Signed)
Patient did attend the evening speaker AA meeting.  

## 2015-04-23 NOTE — [facility] Group Notes (Signed)
[facility] Group Notes: (Clinical Social Work)   04/23/2015      Type of Therapy:  Group Therapy   Participation Level:  Did Not Attend despite MHT prompting   Aleli Navedo Grossman-Orr, LCSW 04/23/2015, 11:05 AM     

## 2015-04-24 MED ORDER — ZOLPIDEM TARTRATE 10 MG PO TABS
10.0000 mg | ORAL_TABLET | Freq: Once | ORAL | Status: AC
  Administered 2015-04-24: 10 mg via ORAL
  Filled 2015-04-24: qty 1

## 2015-04-24 NOTE — Progress Notes (Signed)
[facility] MD Progress Note  04/24/2015 4:42 PM [name]  MRN:  [id number]   Subjective:  Patient reports " I am having a better day today. I miss my kids, however I do understand the process and I am grateful for yall."      Objective:[name] is still focused on wanting to leave. However is more accepting of is current disposition. Patient is aware that he has been IVC'd and that he would have to wait until he is stabilized. Patient I is awake, alert and oriented X3.  Denies suicidal or homicidal ideation. Denies auditory or visual hallucination and does not appear to be responding to internal stimuli. Patient reports he is having a "better day today and although  I miss my kids I need to work on me".  Patient report l am happy that I went to group session on yesterday and today. I know what " I need to do." States his depression 3/10. Patient reports he is using his coping skills to help with his anxiety Reports that trazodone is not helping with sleep. States that "I was blaming everyone but myself and I know that I am the reason for this situation that I caused" Support, encouragement and reassurance was provided.    Principal Problem: Substance induced mood disorder (HCC) Diagnosis:   Patient Active Problem List   Diagnosis Date Noted  . Substance induced mood disorder (HCC) [F19.94] 04/20/2015  . Polysubstance (including opioids) dependence with physiological dependence (HCC) [F19.20] 04/20/2015  . Mood disorder (HCC) [F39] 04/19/2015   Total Time spent with patient: 30 minutes  Past Psychiatric History: see admission H and P  Past Medical History:  Past Medical History  Diagnosis Date  . Back pain   . Opiate abuse, continuous     Past Surgical History  Procedure Laterality Date  . Foot surgery    . Tonsillectomy     Family History: History reviewed. No pertinent family history. Family Psychiatric  History: see admission H and P Social History:  History  Alcohol Use No     Comment: denies     History  Drug Use  . Yes  . Special: Marijuana    Comment: cocaine x1 week ago, marijuana today    Social History   Social History  . Marital Status: Married    Spouse Name: N/A  . Number of Children: N/A  . Years of Education: N/A   Social History Main Topics  . Smoking status: Current Every Day Smoker -- 1.00 packs/day    Types: Cigarettes  . Smokeless tobacco: None  . Alcohol Use: No     Comment: denies  . Drug Use: Yes    Special: Marijuana     Comment: cocaine x1 week ago, marijuana today  . Sexual Activity: Not Asked   Other Topics Concern  . None   Social History Narrative   Additional Social History:      Name of Substance 1: THC 1 - Age of First Use: 14 1 - Amount (size/oz): 1 bowl 1 - Frequency: daily 1 - Duration: years 1 - Last Use / Amount: 04/19/15 - one bowl Name of Substance 2: xanax 2 - Frequency: "whenever I get anxious" Name of Substance 3: cocaine - powder and crack cocaine 3 - Age of First Use: 18 3 - Frequency: once a month 3 - Last Use / Amount: 04/12/15 - 1 gram Name of Substance 4: adderall 4 - Last Use / Amount: 04/19/2015 Name of Substance 5: opiates -  snorted pain pills 5 - Frequency: pt sts used heavily during his 20s  5 - Last Use / Amount: stopped last yr when started going to suboxone clinic          Sleep: Fair  Appetite:  Fair  Current Medications: Current Facility-Administered Medications  Medication Dose Route Frequency Provider Last Rate Last Dose  . acetaminophen (TYLENOL) tablet 650 mg  650 mg Oral Q6H PRN Oneta Rack, NP   650 mg at 04/24/15 0747  . alum & mag hydroxide-simeth (MAALOX/MYLANTA) 200-200-20 MG/5ML suspension 30 mL  30 mL Oral Q4H PRN Oneta Rack, NP      . carbamazepine (TEGRETOL XR) 12 hr tablet 200 mg  200 mg Oral BID Rachael Fee, MD   200 mg at 04/24/15 0743  . cloNIDine (CATAPRES) tablet 0.1 mg  0.1 mg Oral BH-qamhs Kerry Hough, PA-C   0.1 mg at 04/23/15 2117    Followed by  . [START ON 04/25/2015] cloNIDine (CATAPRES) tablet 0.1 mg  0.1 mg Oral QAC breakfast Kerry Hough, PA-C      . dicyclomine (BENTYL) tablet 20 mg  20 mg Oral Q6H PRN Kerry Hough, PA-C      . hydrOXYzine (ATARAX/VISTARIL) tablet 25 mg  25 mg Oral Q6H PRN Kerry Hough, PA-C   25 mg at 04/23/15 2116  . loperamide (IMODIUM) capsule 2-4 mg  2-4 mg Oral PRN Kerry Hough, PA-C      . LORazepam (ATIVAN) tablet 1 mg  1 mg Oral Q6H PRN Rachael Fee, MD   1 mg at 04/23/15 1059  . magnesium hydroxide (MILK OF MAGNESIA) suspension 30 mL  30 mL Oral Daily PRN Oneta Rack, NP      . methocarbamol (ROBAXIN) tablet 500 mg  500 mg Oral Q8H PRN Kerry Hough, PA-C      . naproxen (NAPROSYN) tablet 500 mg  500 mg Oral BID PRN Kerry Hough, PA-C   500 mg at 04/22/15 2219  . nicotine (NICODERM CQ - dosed in mg/24 hours) patch 21 mg  21 mg Transdermal Daily Rachael Fee, MD   21 mg at 04/24/15 0745  . OLANZapine zydis (ZYPREXA) disintegrating tablet 5 mg  5 mg Oral QHS Rachael Fee, MD   5 mg at 04/23/15 2116  . ondansetron (ZOFRAN-ODT) disintegrating tablet 4 mg  4 mg Oral Q6H PRN Kerry Hough, PA-C      . traZODone (DESYREL) tablet 50 mg  50 mg Oral QHS,MR X 1 Kerry Hough, PA-C   50 mg at 04/22/15 2105    Lab Results: No results found for this or any previous visit (from the past 48 hour(s)).  Blood Alcohol level:  Lab Results  Component Value Date   ETH 5* 04/19/2015    Physical Findings: AIMS: Facial and Oral Movements Muscles of Facial Expression: None, normal Lips and Perioral Area: None, normal Jaw: None, normal Tongue: None, normal,Extremity Movements Upper (arms, wrists, hands, fingers): None, normal Lower (legs, knees, ankles, toes): None, normal, Trunk Movements Neck, shoulders, hips: None, normal, Overall Severity Severity of abnormal movements (highest score from questions above): None, normal Incapacitation due to abnormal movements: None,  normal Patient's awareness of abnormal movements (rate only patient's report): No Awareness, Dental Status Current problems with teeth and/or dentures?: No Does patient usually wear dentures?: No  CIWA:    COWS:  COWS Total Score: 8  Musculoskeletal: Strength & Muscle Tone: within normal limits Gait &  Station: normal Patient leans: normal  Psychiatric Specialty Exam: Review of Systems  Constitutional: Negative.   HENT: Negative.   Eyes: Negative.   Respiratory: Negative.   Cardiovascular: Negative.   Gastrointestinal: Negative.   Genitourinary: Negative.   Musculoskeletal: Negative.   Skin: Negative.   Neurological: Negative.   Endo/Heme/Allergies: Negative.   Psychiatric/Behavioral: Positive for depression and substance abuse. Negative for suicidal ideas and hallucinations. The patient is nervous/anxious and has insomnia.   All other systems reviewed and are negative.   Blood pressure 139/79, pulse 85, temperature 97.6 F (36.4 C), temperature source Oral, resp. rate 14, height 6\' 6"  (1.981 m), weight 78.926 kg (174 lb), SpO2 100 %.Body mass index is 20.11 kg/(m^2).  General Appearance: Casual   Eye Contact::  Minimal  Speech:  Clear and Coherent  Volume:  fluctuates  Mood:  Anxious and Depressed  Affect:  anxious worried  Thought Process:  Coherent and Goal Directed  Orientation:  Full (Time, Place, and Person)  Thought Content:  his wanting to leave (ongoing)  Suicidal Thoughts:  No  Homicidal Thoughts:  No  Memory:  Immediate;   Fair Recent;   Fair Remote;   Fair  Judgement:  Impaired  Insight:  Lacking  Psychomotor Activity:  Restlessness  Concentration:  Fair  Recall:  [employer]  Fund of Knowledge:Fair  Language: Fair  Akathisia:  No  Handed:  Right  AIMS (if indicated):     Assets:  Housing Social Support  ADL's:  Intact  Cognition: WNL  Sleep:  Number of Hours: 5    I agree with current treatment plan on 04/24/2015, Patient seen face-to-face for  psychiatric evaluation follow-up, chart reviewed and case discussed with the [facility]. Reviewed the information documented and agree with the treatment plan.  Treatment Plan Summary:  Daily contact with patient to assess and evaluate symptoms and progress in treatment and Medication management  Supportive approach/coping skills/stress management  Polysubstance dependence; continue the clonidine detox protocol Mood instability; continue the Tegretol/Zyprexa combination and optimize response  Discontinue trazodone 50 mg. Po QHS for insomnia  Ambien 10 mg PO QHS for insomnia one time order. Work with CBT/mindfulness  Oneta Rackanika N Lewis, NP 04/24/2015, 4:42 PM Agree with NP Progress Note, as above  Nehemiah MassedFernando [facility], MD

## 2015-04-24 NOTE — Plan of Care (Signed)
Problem: Alteration in mood; excessive anxiety as evidenced by: Goal: STG-Pt will report an absence of self-harm thoughts/actions (Patient will report an absence of self-harm thoughts or actions)  Outcome: Progressing Patient currently denies suicidal ideations.      

## 2015-04-24 NOTE — Plan of Care (Signed)
Problem: Alteration in mood & ability to function due to Goal: STG-Pt will be introduced to the 12-step program of recovery (Patient will be introduced to the 12-step program of recovery and disease concept of addiction)  Outcome: Progressing Patient attended AA meeting tonight.     

## 2015-04-24 NOTE — Progress Notes (Signed)
Writer spoke with patient 1:1 and he reports having had a good day. He was informed of the change in his sleep medication and was pleased. He reports that he is working on keeping a positive attitude and wants to find him a sponsor. Support given and safety maintained on unit with 15 min checks.

## 2015-04-24 NOTE — [facility] Group Notes (Signed)
[facility] Group Notes:  (Clinical Social Work)  04/24/2015  10:00-11:00AM  Summary of Progress/Problems:   The main focus of today's process group was to   1)  discuss the importance of adding supports  2)  define health supports versus unhealthy supports  3)  identify the patient's current unhealthy supports and plan how to handle them  4)  Identify the patient's current healthy supports and plan what to add.  An emphasis was placed on using counselor, doctor, therapy groups, 12-step groups, and problem-specific support groups to expand supports.    The patient expressed full comprehension of the concepts presented, and agreed that there is a need to add more supports.  The patient stated his father, mother, stepdad, doctor, NA, some friends, dog, kids, and God are his healthy supports.  He took a lot of notes during group and was an Building control surveyorenthusiastic participant.  Type of Therapy:  Process Group with Motivational Interviewing  Participation Level:  Active  Participation Quality:  Appropriate, Attentive, Sharing and Supportive  Affect:  Appropriate  Cognitive:  Appropriate  Insight:  Engaged  Engagement in Therapy:  Engaged  Modes of Intervention:   Education, Support and Processing, Activity  Ambrose MantleMareida Grossman-Orr, LCSW 04/24/2015

## 2015-04-24 NOTE — Progress Notes (Signed)
D) Pt has been attending the program and writing in his journal throughout the day. States he is enjoying writing his feelings down and then going over them and reading them. Has been sharing them with the staff and with his peers. Pt's attitude has changed and Pt is working hard on himself and making healthier choices for himself. Affect is brighter with interaction. Rates his depression at a 3, hopelessness at a 2 and his anxiety at a 3. Denies SI and HI. A) Given support, reassurance and praise. Provided with a 1:1. Praised for his writings and his willingness to sit and write everything down. Encouragement given. R) Pt's affect is brighter and mood is a brighter with interaction. Is grateful for the group about his support systems in his life.

## 2015-04-25 MED ORDER — OLANZAPINE 5 MG PO TBDP: 5.0000 mg | ORAL_TABLET | Freq: Every day | ORAL | Status: DC

## 2015-04-25 MED ORDER — NICOTINE 21 MG/24HR TD PT24: 21.0000 mg | MEDICATED_PATCH | Freq: Every day | TRANSDERMAL | Status: DC

## 2015-04-25 MED ORDER — CARBAMAZEPINE ER 200 MG PO TB12: 200.0000 mg | ORAL_TABLET | Freq: Two times a day (BID) | ORAL | Status: DC

## 2015-04-25 NOTE — Plan of Care (Signed)
Problem: Diagnosis: Increased Risk For Suicide Attempt Goal: STG-Patient Will Comply With Medication Regime Outcome: Progressing Patient was compliant with scheduled medications.      

## 2015-04-25 NOTE — Progress Notes (Signed)
[name] was discharged to lobby with a cab voucher. AVS, transition report, SRA, scripts, follow-up appointments, and med samples were all reviewed and signed for when relevant. Pt verbalized understanding. Pt received and signed for all belongings. Pt verbalized importance of attendance at 12-step meetings and follow-up appointments. Pt verbalized readiness for discharge and appeared in no acute distress.

## 2015-04-25 NOTE — Progress Notes (Signed)
Patient did attend the evening speaker AA meeting.  

## 2015-04-25 NOTE — Progress Notes (Signed)
Writer was informed by MHT, [name] that the patient informed her that his roommate has been asking him about where he can buy heroin and he admits that he told him of a few places but asked that he stop talking about it because he has been clean a few years now and is working on staying clean. He asked that he not be spoken to about this because he doesn't want him to know this information was shared. This information was also placed on his roommates sticky notes for his doctor to be made aware.

## 2015-04-25 NOTE — Discharge Summary (Signed)
Physician Discharge Summary Note  Patient:  [name] is an 35 y.o., male MRN:  [id number] DOB:  [birth year] Patient phone:  [phone] (home)  Patient address:   [address] [place] [address],  Total Time spent with patient: 30 minutes  Date of Admission:  04/19/2015 Date of Discharge: 04/25/2015  Reason for Admission:    Principal Problem: Substance induced mood disorder [facility]) Discharge Diagnoses: Patient Active Problem List   Diagnosis Date Noted  . Substance induced mood disorder (HCC) [F19.94] 04/20/2015  . Polysubstance (including opioids) dependence with physiological dependence (HCC) [F19.20] 04/20/2015  . Mood disorder (HCC) [F39] 04/19/2015    Past Psychiatric History:  See above noted  Past Medical History:  Past Medical History  Diagnosis Date  . Back pain   . Opiate abuse, continuous     Past Surgical History  Procedure Laterality Date  . Foot surgery    . Tonsillectomy     Family History: History reviewed. No pertinent family history. Family Psychiatric  History:  Denied Social History:  History  Alcohol Use No    Comment: denies     History  Drug Use  . Yes  . Special: Marijuana    Comment: cocaine x1 week ago, marijuana today    Social History   Social History  . Marital Status: Married    Spouse Name: N/A  . Number of Children: N/A  . Years of Education: N/A   Social History Main Topics  . Smoking status: Current Every Day Smoker -- 1.00 packs/day    Types: Cigarettes  . Smokeless tobacco: None  . Alcohol Use: No     Comment: denies  . Drug Use: Yes    Special: Marijuana     Comment: cocaine x1 week ago, marijuana today  . Sexual Activity: Not Asked   Other Topics Concern  . None   Social History Narrative    Hospital Course:  [name] was admitted for Substance induced mood disorder [facility]) and crisis management.  He was treated with the following medications listed below.  [name] was discharged with  current medication and was instructed on how to take medications as prescribed; (details listed below under Medication List).  Medical problems were identified and treated as needed.  Home medications were restarted as appropriate.  Improvement was monitored by observation and [name] daily report of symptom reduction.  Emotional and mental status was monitored by daily self-inventory reports completed by [name] and clinical staff.         [name] was evaluated by the treatment team for stability and plans for continued recovery upon discharge.  [name] motivation was an integral factor for scheduling further treatment.  Employment, transportation, bed availability, health status, family support, and any pending legal issues were also considered during his hospital stay.  He was offered further treatment options upon discharge including but not limited to Residential, Intensive Outpatient, and Outpatient treatment.  [name] will follow up with the services as listed below under Follow Up Information.      Upon completion of this admission the [name] was both mentally and medically stable for discharge denying suicidal/homicidal ideation, auditory/visual/tactile hallucinations, delusional thoughts and paranoia.     Physical Findings: AIMS: Facial and Oral Movements Muscles of Facial Expression: None, normal Lips and Perioral Area: None, normal Jaw: None, normal Tongue: None, normal,Extremity Movements Upper (arms, wrists, hands, fingers): None, normal Lower (legs, knees,  ankles, toes): None, normal, Trunk Movements Neck, shoulders, hips: None, normal, Overall Severity Severity of abnormal movements (highest score from questions above): None, normal Incapacitation due to abnormal movements: None, normal Patient's awareness of abnormal movements (rate only patient's report): No Awareness, Dental Status Current problems with teeth and/or  dentures?: No Does patient usually wear dentures?: No  CIWA:    COWS:  COWS Total Score: 8  Musculoskeletal: Strength & Muscle Tone: within normal limits Gait & Station: normal Patient leans: N/A  Psychiatric Specialty Exam: Review of Systems  Psychiatric/Behavioral: Negative for depression, suicidal ideas and substance abuse. The patient is not nervous/anxious.   All other systems reviewed and are negative.   Blood pressure 128/72, pulse 86, temperature 97.8 F (36.6 C), temperature source Oral, resp. rate 20, height  (1.981 m), weight 78.926 kg (174 lb), SpO2 100 %.Body mass index is 20.11 kg/(m^2).  Have you used any form of tobacco in the last 30 days? (Cigarettes, Smokeless Tobacco, Cigars, and/or Pipes): Yes  Has this patient used any form of tobacco in the last 30 days? (Cigarettes, Smokeless Tobacco, Cigars, and/or Pipes) Yes, RX given   Blood Alcohol level:  Lab Results  Component Value Date   ETH 5* 04/19/2015    Metabolic Disorder Labs:  No results found for: HGBA1C, MPG No results found for: PROLACTIN No results found for: CHOL, TRIG, HDL, CHOLHDL, VLDL, LDLCALC  See Psychiatric Specialty Exam and Suicide Risk Assessment completed by Attending Physician prior to discharge.  Discharge destination:  Home  Is patient on multiple antipsychotic therapies at discharge:  No   Has Patient had three or more failed trials of antipsychotic monotherapy by history:  No  Recommended Plan for Multiple Antipsychotic Therapies: NA     Medication List    STOP taking these medications        amoxicillin 500 MG capsule  Commonly known as:  AMOXIL     amphetamine-dextroamphetamine 30 MG tablet  Commonly known as:  ADDERALL     buprenorphine-naloxone 8-2 MG Subl SL tablet  Commonly known as:  SUBOXONE     HYDROcodone-acetaminophen 5-325 MG tablet  Commonly known as:  NORCO/VICODIN      TAKE these medications      Indication   carbamazepine 200 MG 12 hr tablet   Commonly known as:  TEGRETOL XR  Take 1 tablet (200 mg total) by mouth 2 (two) times daily.   Indication:  mood stabilization     nicotine 21 mg/24hr patch  Commonly known as:  NICODERM CQ - dosed in mg/24 hours  Place 1 patch (21 mg total) onto the skin daily.   Indication:  Nicotine Addiction     OLANZapine zydis 5 MG disintegrating tablet  Commonly known as:  ZYPREXA  Take 1 tablet (5 mg total) by mouth at bedtime.   Indication:  Major Depressive Disorder, mood control           Follow-up Information    Follow up with [facility] On 04/26/2015.   Why:  Walk in at 9:00AM for hospital follow-up/medication management/assessment for mental health services. Please bring Photo ID, social security card, insurance card if you have it, and proof of income if you have it. (And wife's proof of income). Thank you.   Contact information:   [address] [place], [place] [address] Phone: [phone] Fax: [phone]      Follow up with [facility].   Why:  Once insurance card is received, please call office to schedule  new patient assessment for medication management and counseling. Thank you.    Contact information:   [address] [place], [place] [address] Phone: [phone] Fax: [phone]      Follow-up recommendations:  Activity:  as tol Diet:  as tol  Comments:  1.  Take all your medications as prescribed.              2.  Report any adverse side effects to outpatient provider.                       3.  Patient instructed to not use alcohol or illegal drugs while on prescription medicines.            4.  In the event of worsening symptoms, instructed patient to call 911, the crisis hotline or go to nearest emergency room for evaluation of symptoms.  Signed: Lindwood QuaSheila May Agustin, NP [facility] 04/25/2015, 1:29 PM   Patient seen, Suicide Assessment Completed.  Disposition Plan Reviewed
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
## 2015-04-25 NOTE — Progress Notes (Signed)
CSW spoke with patient's step-father Chase CallerBill Comer [phone] to discuss patient's treatment/discharge plans. Step-father denies that patient can stay with mother at discharge and reports that patient has no where to live. He expresses concern that patient has limited insight into his addiction and is worried that patient will relapse. CSW provided support and validated his concerns. CSW agreeable to share concerns with patient and discuss where patient can stay at discharge.   Samuella BruinKristin Dannia Snook, LCSW Clinical Social Worker [facility] [phone]

## 2015-04-25 NOTE — Progress Notes (Addendum)
Recreation Therapy Notes  Date: 03.06.2017 Time: 9:30am Location: 300 Hall Group Room   Group Topic: Stress Management  Goal Area(s) Addresses:  Patient will actively participate in stress management techniques presented during session.   Behavioral Response: Agitated, Engaged in techniques introduced   Intervention: Stress management techniques  Activity :  Deep Breathing, Mindfulness and Mindful Breathing. LRT provided education, instruction and demonstration on practice of Deep Breathing, Mindfulness and Mindful Breathing. Patient was asked to participate in technique introduced during session.   Education:  Stress Management, Discharge Planning.   Education Outcome: Acknowledges education  Clinical Observations/Feedback: Patient actively engaged in technique introduced, expressed no concerns regarding technique and demonstrated ability to practice independently post d/c. Despite patient positive engagement in techniques introduced patient agitated by peers in session. Patient and peer reported another patient (his roommate) on unit has expressed numerous times he wants to get heroin inpatient and that he is asking peers where he can get heroin. Patient became very upset by this and expressed his discontent to peers and LRT. Patient additionally expressed concerns for his children as he feels his wife cannot provide a stable living environment for them.   Marykay Lexenise L Arnaldo Heffron, LRT/CTRS  Jearl KlinefelterBlanchfield, Stepfon Rawles L 04/25/2015 2:33 PM

## 2015-04-25 NOTE — Progress Notes (Addendum)
  [facility] :  Will you be returning to the same living situation after discharge:  Yes patient plans to return home  At discharge, do you have transportation home?: Yes, taxi voucher provided Do you have the ability to pay for your medications: Yes,  patient will be provided with prescriptions at discharge  Release of information consent forms completed and in the chart;  Patient's signature needed at discharge.  Patient to Follow up at: Follow-up Information    [facility] [place] [facility] 04/26/2015.   Why:  Walk in at 9:00AM for hospital follow-up/medication management/assessment for mental health services. Please bring Photo ID, social security card, insurance card if you have it, and proof of income if you have it. (And wife's proof of income). Thank you.   Contact information:   [address] [place], [place] [address] Phone: [phone] Fax: [phone]      Follow up with [facility].   Why:  Once insurance card is received, please call office to schedule new patient assessment for medication management and counseling. Thank you.    Contact information:   [address] [place], [place] [address] Phone: [phone] Fax: [phone]      Next level of care provider has access to [facility] Link:no  Safety Planning and Suicide Prevention discussed: Yes,  with patient and wife  Have you used any form of tobacco in the last 30 days? (Cigarettes, Smokeless Tobacco, Cigars, and/or Pipes): Yes  Has patient been referred to the Quitline?: Patient refused referral  Patient has been referred for addiction treatment: Yes  Nicolina Hirt, [place] CarboKristin L 04/25/2015, 10:33 AM

## 2015-04-25 NOTE — [facility] Group Notes (Signed)
[facility] LCSW Group Therapy 04/25/2015  1:15 pm  Type of Therapy: Group Therapy Participation Level: Active  Participation Quality: Attentive, Sharing and Supportive  Affect: Appropriate  Cognitive: Alert and Oriented  Insight: Developing/Improving and Engaged  Engagement in Therapy: Developing/Improving and Engaged  Modes of Intervention: Clarification, Confrontation, Discussion, Education, Exploration,  Limit-setting, Orientation, Problem-solving, Rapport Building, Dance movement psychotherapisteality Testing, Socialization and Support  Summary of Progress/Problems: Pt identified obstacles faced currently and processed barriers involved in overcoming these obstacles. Pt identified steps necessary for overcoming these obstacles and explored motivation (internal and external) for facing these difficulties head on. Pt further identified one area of concern in their lives and chose a goal to focus on for today. Patient states that he is feeling better and plans to discharge to his mother's home today to follow up with outpatient services.   Samuella BruinKristin Moris Ratchford, LCSW Clinical Social Worker [facility] [phone]

## 2015-04-25 NOTE — [facility] Suicide Risk Assessment (Signed)
[facility]   Principal Problem: Substance induced mood disorder [facility](HCC) Discharge Diagnoses:  Patient Active Problem List   Diagnosis Date Noted  . Substance induced mood disorder (HCC) [F19.94] 04/20/2015  . Polysubstance (including opioids) dependence with physiological dependence (HCC) [F19.20] 04/20/2015  . Mood disorder (HCC) [F39] 04/19/2015    Total Time spent with patient: 30 minutes  Musculoskeletal: Strength & Muscle Tone: within normal limits Gait & Station: normal Patient leans: N/A  Psychiatric Specialty Exam: ROS  Blood pressure 128/72, pulse 86, temperature 97.8 F (36.6 C), temperature source Oral, resp. rate 20, height 6\' 6"  (1.981 m), weight 174 lb (78.926 kg), SpO2 100 %.Body mass index is 20.11 kg/(m^2).  General Appearance: improved grooming   Eye Contact::  Good  Speech:  Normal Rate409  Volume:  Normal  Mood:  reports improved mood   Affect:  Appropriate and more reactive   Thought Process:  Linear  Orientation:  Full (Time, Place, and Person)  Thought Content:  no hallucinations, no delusions   Suicidal Thoughts:  No denies any suicidal ideations, no self injurious ideations  Homicidal Thoughts:  No denies any homicidal ideations, denies any violent ideations   Memory:  recent and remote grossly intact   Judgement:  Other:  improving   Insight:  improving   Psychomotor Activity:  Normal  Concentration:  Good  Recall:  Good  Fund of Knowledge:Good  Language: Good  Akathisia:  Negative  Handed:  Right  AIMS (if indicated):     Assets:  Desire for Improvement Resilience  Sleep:  Number of Hours: 2.5  Cognition: WNL  ADL's:  Intact   Mental Status Per Nursing Assessment::   On Admission:     Demographic Factors:  35 year old male, married,  two children ,  ( wife and children currently staying with patient's mother ), denies homelessness   Loss Factors: Recently lost job   Historical Factors: History of  polysubstance abuse  , no history of suicide attempts  Risk Reduction Factors:   Responsible for children under 34 years of age, Sense of responsibility to family and Positive coping skills or problem solving skills  Continued Clinical Symptoms:  At this time patient reports mood is improved, states " I feel much better", affect is reactive, improved in range, denies depression today, no thought disorder, denies any suicidal or homicidal ideations , no hallucinations, no delusions, future oriented  States " today I plan to go see my kids and then I am going to start going to NA meetings and get a sponsor ". " I really want to stay sober " Denies medication side effects.  Cognitive Features That Contribute To Risk:  No gross cognitive deficits noted upon discharge. Is alert , attentive, and oriented x 3  Suicide Risk:  Mild   Follow-up Information    [facility] On 04/26/2015.   Why:  Walk in at 9:00AM for hospital follow-up/medication management/assessment for mental health services. Please bring Photo ID, social security card, insurance card if you have it, and proof of income if you have it. (And wife's proof of income). Thank you.   Contact information:   [address] [place], [place] [address] Phone: [phone] Fax: [phone]      Follow up with [facility].   Why:  Once insurance card is received, please call office to schedule new patient assessment for medication management and counseling. Thank you.    Contact information:   [address] [place], [place] [address] Phone: [phone] Fax: [phone]      Plan Of Care/Follow-up recommendations:  Activity:  as tolerated  Diet:  Regular Tests:  NA Other:  See below  Patient is wanting to discharge today There are no current grounds for involuntary commitment . Planning to follow up as above. As above, states he plans to go to NA meetings regularly. Leaving unit in good spirits . Nehemiah Massed, MD 04/25/2015, 1:29 PM

## 2015-04-25 NOTE — Progress Notes (Signed)
CSW met with patient to share step-father's concerns. Patient states that he will return home as his wife is now staying with her mother. He plans to follow up with outpatient services and continues to feel safe to return home today. CSW and patient contacted step-father to discuss. Step-father unable to transport, taxi voucher provided.   Tilden Fossa, LCSW Clinical Social Worker [facility] [phone]

## 2015-04-25 NOTE — Plan of Care (Signed)
Problem: Diagnosis: Increased Risk For Suicide Attempt Goal: STG-Patient Will Attend All Groups On The Unit Outcome: Progressing Patient attended AA meeting tonight.     

## 2016-02-02 ENCOUNTER — Emergency Department (HOSPITAL_COMMUNITY)
Admission: EM | Admit: 2016-02-02 | Discharge: 2016-02-02 | Disposition: A | Discharge status: Home / Self Care | Payer: Medicaid Other | Attending: Emergency Medicine | Admitting: Emergency Medicine

## 2016-02-02 ENCOUNTER — Encounter (HOSPITAL_COMMUNITY): Payer: Self-pay

## 2016-02-02 DIAGNOSIS — Z791 Long term (current) use of non-steroidal anti-inflammatories (NSAID): Secondary | ICD-10-CM | POA: Insufficient documentation

## 2016-02-02 DIAGNOSIS — F17210 Nicotine dependence, cigarettes, uncomplicated: Secondary | ICD-10-CM | POA: Insufficient documentation

## 2016-02-02 DIAGNOSIS — H6692 Otitis media, unspecified, left ear: Secondary | ICD-10-CM | POA: Insufficient documentation

## 2016-02-02 DIAGNOSIS — H9202 Otalgia, left ear: Secondary | ICD-10-CM | POA: Diagnosis present

## 2016-02-02 DIAGNOSIS — H6690 Otitis media, unspecified, unspecified ear: Secondary | ICD-10-CM

## 2016-02-02 MED ORDER — AMOXICILLIN 500 MG PO CAPS: 500.0000 mg | ORAL_CAPSULE | Freq: Three times a day (TID) | ORAL | 0 refills | Status: DC

## 2016-02-02 MED ORDER — NAPROXEN 500 MG PO TABS: 500.0000 mg | ORAL_TABLET | Freq: Two times a day (BID) | ORAL | 0 refills | Status: AC

## 2016-02-02 NOTE — Discharge Instructions (Signed)
Avoid using any over-the-counter medications or therapies in your ear. Follow-up with your primary doctor or return to the ER for any worsening symptoms.

## 2016-02-02 NOTE — ED Triage Notes (Signed)
Pt c/o left earache since Sunday.

## 2016-02-02 NOTE — ED Provider Notes (Signed)
[facility]-EM[facility]Y DEPT Provider Note   CSN: [id number] Arrival date & time: 02/02/16  1116     History   Chief Complaint Chief Complaint  Patient presents with  . Otalgia    HPI Kirk SabinsBrian G Wagman is a 35 y.o. male.  HPI   Kirk SabinsBrian G Shaw is a 35 y.o. male who presents to the Emergency Department complaining of Left ear pain for 4 days. He states pain is been gradually worsening. Ear pain began after having what he described as a "cold" he reported nasal congestion and sneezing and occasional cough that seemed to improve for his ear symptoms began. He has not been taking any medications for pain. He is been applying eardrops and clean to 0 with a Q-tip without relief. He denies fever, vomiting, dizziness, and neck pain.   Past Medical History:  Diagnosis Date  . Back pain   . Opiate abuse, continuous     Patient Active Problem List   Diagnosis Date Noted  . Substance induced mood disorder (HCC) 04/20/2015  . Polysubstance (including opioids) dependence with physiological dependence (HCC) 04/20/2015  . Mood disorder (HCC) 04/19/2015    Past Surgical History:  Procedure Laterality Date  . FOOT SURGERY    . TONSILLECTOMY         Home Medications    Prior to Admission medications   Medication Sig Start Date End Date Taking? Authorizing Provider  carbamazepine (TEGRETOL XR) 200 MG 12 hr tablet Take 1 tablet (200 mg total) by mouth 2 (two) times daily. 04/25/15   Adonis BrookSheila Agustin, NP  nicotine (NICODERM CQ - DOSED IN MG/24 HOURS) 21 mg/24hr patch Place 1 patch (21 mg total) onto the skin daily. 04/25/15   Adonis BrookSheila Agustin, NP  OLANZapine zydis (ZYPREXA) 5 MG disintegrating tablet Take 1 tablet (5 mg total) by mouth at bedtime. 04/25/15   Adonis BrookSheila Agustin, NP    Family History No family history on file.  Social History Social History  Substance Use Topics  . Smoking status: Current Every Day Smoker    Packs/day: 1.00    Types: Cigarettes  . Smokeless tobacco: Never Used  .  Alcohol use No     Comment: denies     Allergies   Penicillins   Review of Systems Review of Systems  Constitutional: Negative for activity change, appetite change, chills and fever.  HENT: Positive for congestion, ear pain and rhinorrhea. Negative for facial swelling, sore throat and trouble swallowing.   Eyes: Negative for visual disturbance.  Respiratory: Positive for cough. Negative for shortness of breath, wheezing and stridor.   Gastrointestinal: Negative for nausea and vomiting.  Musculoskeletal: Negative for neck pain and neck stiffness.  Skin: Negative.   Neurological: Negative for dizziness, weakness, numbness and headaches.  Hematological: Negative for adenopathy.  Psychiatric/Behavioral: Negative for confusion.  All other systems reviewed and are negative.    Physical Exam Updated Vital Signs BP 134/75 (BP Location: Right Arm)   Pulse 74   Temp 98.1 F (36.7 C) (Oral)   Resp 20   Ht 6\' 5"  (1.956 m)   Wt 83.9 kg   SpO2 100%   BMI 21.94 kg/m   Physical Exam  Constitutional: He is oriented to person, place, and time. He appears well-developed and well-nourished. No distress.  HENT:  Head: Normocephalic and atraumatic.  Mouth/Throat: Uvula is midline, oropharynx is clear and moist and mucous membranes are normal. No uvula swelling. No oropharyngeal exudate.  Erythema of the left TM.  Mild middle ear effusion present  and mild bulging of the TM.  No drainage or edema of the ear canal, TM appears intact.    Neck: Normal range of motion. Neck supple.  Cardiovascular: Normal rate, regular rhythm and intact distal pulses.   Pulmonary/Chest: Effort normal and breath sounds normal. No stridor. No respiratory distress.  Musculoskeletal: Normal range of motion.  Lymphadenopathy:    He has no cervical adenopathy.  Neurological: He is alert and oriented to person, place, and time. Coordination normal.  Skin: Skin is warm and dry. No rash noted.  Nursing note and vitals  reviewed.    ED Treatments / Results  Labs (all labs ordered are listed, but only abnormal results are displayed) Labs Reviewed - No data to display  EKG  EKG Interpretation None       Radiology No results found.  Procedures Procedures (including critical care time)  Medications Ordered in ED Medications - No data to display   Initial Impression / Assessment and Plan / ED Course  I have reviewed the triage vital signs and the nursing notes.  Pertinent labs & imaging results that were available during my care of the patient were reviewed by me and considered in my medical decision making (see chart for details).  Clinical Course     Patient well appearing. Nontoxic. Vital signs are stable. Left otitis media is present. Without perforation. Patient agrees to treatment with antibiotics and PMD follow-up if needed.  Final Clinical Impressions(s) / ED Diagnoses   Final diagnoses:  Acute otitis media, unspecified otitis media type    New Prescriptions New Prescriptions   No medications on file     Rosey Bathammy Alayshia Marini, PA-C 02/04/16 2232    Marily MemosJason Mesner, MD 02/07/16 [id number]

## 2016-02-23 ENCOUNTER — Emergency Department (HOSPITAL_COMMUNITY)
Admission: EM | Admit: 2016-02-23 | Discharge: 2016-02-23 | Disposition: A | Discharge status: Home / Self Care | Payer: Medicaid Other | Attending: Emergency Medicine | Admitting: Emergency Medicine

## 2016-02-23 ENCOUNTER — Encounter (HOSPITAL_COMMUNITY): Payer: Self-pay

## 2016-02-23 DIAGNOSIS — T401X1A Poisoning by heroin, accidental (unintentional), initial encounter: Secondary | ICD-10-CM

## 2016-02-23 DIAGNOSIS — F17210 Nicotine dependence, cigarettes, uncomplicated: Secondary | ICD-10-CM | POA: Insufficient documentation

## 2016-02-23 MED ORDER — NALOXONE HCL 0.4 MG/ML IJ SOLN: INTRAMUSCULAR | 3 refills | Status: DC

## 2016-02-23 NOTE — ED Notes (Signed)
Pt able to walk to the bathroom and had no problems with using the bathroom

## 2016-02-23 NOTE — ED Provider Notes (Signed)
[facility] DEPT Provider Note   CSN: [id number] Arrival date & time: 02/23/16  0153     History   Chief Complaint Chief Complaint  Patient presents with  . Drug Overdose    HPI [name] is a 36 y.o. male.  HPI  The patient is a 36 year old male whose get a known history of substance abuse including opiate abuse dating back more than 10 years when he originally was abusing Percocet and Vicodin type medications. He states that he has used heroin a couple of times over the last couple of years but does not use it frequently, tonight he decided to use IV heroin for the first time and apparently became apneic shortly thereafter, thankfully there was a bystander in the house with him who called 911. Paramedics found the patient to be agonal, having insufficient respiratory effort, they were able to give IV Narcan, 2 mg with good improvement and the patient returned to baseline mental status rather quickly. The patient denies that this was a suicide attempt but states that he was just trying to get high. He denies any increasing depression, suicidal thoughts, hallucinations or other substance abuse problems. He is currently in Alcoholics Anonymous and his last drink was 3 months ago. He does have a history of cocaine but denies any recent use. The patient denies any physical complaints including headache, nausea, diarrhea, abdominal pain, chest pain, palpitations, blurred vision.  Past Medical History:  Diagnosis Date  . Back pain   . Opiate abuse, continuous     Patient Active Problem List   Diagnosis Date Noted  . Substance induced mood disorder (HCC) 04/20/2015  . Polysubstance (including opioids) dependence with physiological dependence (HCC) 04/20/2015  . Mood disorder (HCC) 04/19/2015    Past Surgical History:  Procedure Laterality Date  . FOOT SURGERY    . TONSILLECTOMY         Home Medications    Prior to Admission medications   Medication Sig Start Date End  Date Taking? Authorizing Provider  amoxicillin (AMOXIL) 500 MG capsule Take 1 capsule (500 mg total) by mouth 3 (three) times daily. 02/02/16   Tammy Triplett, PA-C  ibuprofen (ADVIL,MOTRIN) 200 MG tablet Take 400 mg by mouth every 6 (six) hours as needed.    Historical Provider, MD  Isopropyl Alcohol (SWIMMERS EAR DROPS OT) Place 2 drops in ear(s) 2 (two) times daily.    Historical Provider, MD  naloxone Jonelle Sports) 0.4 MG/ML injection Inject 0.4mg  IM X 1 for drug overdose 02/23/16   Eber Hong, MD  naproxen (NAPROSYN) 500 MG tablet Take 1 tablet (500 mg total) by mouth 2 (two) times daily with a meal. 02/02/16   Tammy Triplett, PA-C    Family History No family history on file.  Social History Social History  Substance Use Topics  . Smoking status: Current Every Day Smoker    Packs/day: 1.00    Types: Cigarettes  . Smokeless tobacco: Never Used  . Alcohol use No     Comment: denies     Allergies   Patient has no known allergies.   Review of Systems Review of Systems  All other systems reviewed and are negative.    Physical Exam Updated Vital Signs BP 128/83   Pulse 94   Temp 97.7 F (36.5 C) (Oral)   Resp 15   SpO2 100%   Physical Exam  Constitutional: He appears well-developed and well-nourished. No distress.  HENT:  Head: Normocephalic and atraumatic.  Mouth/Throat: Oropharynx is clear and  moist. No oropharyngeal exudate.  No signs of hematomas, bruising, abrasions, lacerations  Eyes: Conjunctivae and EOM are normal. Pupils are equal, round, and reactive to light. Right eye exhibits no discharge. Left eye exhibits no discharge. No scleral icterus.  Pupils are 2 mm and reactive bilaterally  Neck: Normal range of motion. Neck supple. No JVD present. No thyromegaly present.  Cardiovascular: Regular rhythm, normal heart sounds and intact distal pulses.  Exam reveals no gallop and no friction rub.   No murmur heard. Mild tachycardia  Pulmonary/Chest: Effort normal and  breath sounds normal. No respiratory distress. He has no wheezes. He has no rales.  Lungs are clear, speaks in full sentences, no distress  Abdominal: Soft. Bowel sounds are normal. He exhibits no distension and no mass. There is no tenderness.  Soft and nontender abdomen, no hepatosplenomegaly  Musculoskeletal: Normal range of motion. He exhibits no edema or tenderness.  No deformity edema or abnormality to the extremities, unsure wounds to the left antecubital fossa present, no other track marks  Lymphadenopathy:    He has no cervical adenopathy.  Neurological: He is alert. Coordination normal.  The patient follows commands, has clear speech, cranial nerves III through XII are normal, normal strength in all 4 extremities  Skin: Skin is warm and dry. No rash noted. No erythema.  Psychiatric: He has a normal mood and affect. His behavior is normal.  Nursing note and vitals reviewed.    ED Treatments / Results  Labs (all labs ordered are listed, but only abnormal results are displayed) Labs Reviewed - No data to display  EKG  EKG Interpretation None       Radiology No results found.  Procedures Procedures (including critical care time)  Medications Ordered in ED Medications - No data to display   Initial Impression / Assessment and Plan / ED Course  I have reviewed the triage vital signs and the nursing notes.  Pertinent labs & imaging results that were available during my care of the patient were reviewed by me and considered in my medical decision making (see chart for details).  Clinical Course      EKG reveals sinus tachycardia with nonspecific T waves, otherwise the patient has no acute findings on his exam, he does not want intervention, does not want inpatient treatment, does not think he has a substance abuse problem which needs acute intervention. He does not appear depressed or suicidal, needs observation on cardiac monitor to make sure he does not become  apneic again when the Narcan wears off  Observation period without recurrent sx - stable for d/c - he has tolerated PO and ambulated and wanting to go home,  Narcan Rx given.  Final Clinical Impressions(s) / ED Diagnoses   Final diagnoses:  Accidental overdose of heroin, initial encounter    New Prescriptions New Prescriptions   NALOXONE (NARCAN) 0.4 MG/ML INJECTION    Inject 0.4mg  IM X 1 for drug overdose     Eber HongBrian Sheneika Walstad, MD 02/23/16 [phone]

## 2016-02-23 NOTE — ED Triage Notes (Signed)
EMS reports being called out for heroin overdose. Upon arrival pt had agonal respirations. EMS administered 2 mg of Narcan IV and pt became alert and breathing normally.

## 2016-02-23 NOTE — Discharge Instructions (Signed)
Substance Abuse Treatment Programs ° °Intensive Outpatient Programs °[facility]     °[address]      °[place], [place]                   °[phone]      ° °[facility] °[address] °[place], [place] °[phone] ° °[facility]     °(Inpatient and outpatient)     °[address]           °[phone]   ° °[facility] °[phone] (Suboxone and Methadone) ° °[address]      °[place], [place] [address]      °[phone]      ° °[address] °[place], [place] °[phone] ° °[facility] (Outpatient/Inpatient, Chemical)    °(insurance only) [phone]      °       °[facility] (Groups & Residential) °[place], [place] °[phone] ° °   °[facility]     °[address]     °[place], [place]      °[phone]      ° °[facility] (for caregivers and family) °[address] °[place], [place] °336-299-4655 ° ° ° ° ° °Residential Treatment Programs °[facility]      °[address], [place], [place] [address]  °[phone]      ° °[facility] °[address] [place], [place] [address] °[phone] ° °[facility]        °[phone]      ° °[facility] °1-800-659-3381 ° °[facility]             °[address]                                         °[place], [place]                                                °[phone] or 336-784-9470                              ° °[facility] [place] °[address] °[place], [address] °[phone] ° °[facility]    °[address]      °[place], [place]     °[phone]      ° °[facility] °[address] °[place], [place] °[phone] ° °[facility]   °[address]     °[place], [place] [address]     °[phone]      °Admissions: 8am-3pm M-F ° °[facility] °[address] °[place],  [place] °[phone] ° °[facility]: Residential Program (90 Days)   °[place], [place]      °336-725-8389 or 800-758-6077    ° °[facility] °[place], [place] °(Walk in Hours over the weekend or by referral) ° °[facility] °[address], [place], [place] [address] °([phone] ° °Crisis Mobile: [facility]:  1-877-626-1772 (for crisis response 24 hours a day) °[facility] Hotline:      1-800-256-2452 °Outpatient Psychiatry and Counseling ° °[facility]: Mobile Crisis   Management 24 hours:  1-877-626-1772 ° °[facility] sliding scale fee and walk in schedule: M-F 8am-12pm/1pm-3pm °[address]  °[place], [place] [address] °[phone] ° °[facility] °[address] °[place], [place] [address] °[phone] ° °[facility] ([facility])- new patient walk-in appointments available Monday - Friday 8am -3pm.          °[address] °[place], [place] [address] °336-676-6840 or crisis line- 336-676-6905 ° °[facility] Intensive Outpatient Therapy Program °[address] °[place], [place] [address] °336-832-9804 ° °[facility]                  °Crisis Services      °[phone]      °[address]     °[place], [place] [address]                ° °[facility]   °[facility] °[phone] °[address] °[place], [place] [address] ° ° °[facility]          °[address]  °[place], [place] [address]       °([phone] ° °[facility] °[address] °[place], [place] [address] °336-292-1510 ° °[facility]    °[address]    °[place], [place] [address]     °[phone]      ° °Parish McKinney, MD     °[address]     °[place] [address]     °[phone]     °  °[facility] °[address] °[place] [address] ° °[facility]     °[address]     °[place], [place]      °336-542-2076      ° °[facility] °Shamsher Ahluwalia, MD °[address] °[place], [place] [address] °[phone] ° °[facility]     °[address]     °[place], [place] [address]     °336-274-1237      ° °[facility] °[address] °[place], [place] [address] °336-854-4450 °Resources for Temporary Residential Assistance/Crisis Centers ° °DAY CENTERS °[facility] °M-F 8am-3pm   °[address] [place], [place] [address]   336-332-0824 °Services include: laundry, barbering, support groups, case management, phone  & computer access, showers, AA/NA mtgs, mental health/substance abuse nurse, job skills class, disability information, VA assistance, spiritual classes, etc.  ° °HOMELESS SHELTERS ° °[facility]     °[facility]   °[address], [place]     °[phone]       °       °[facility] (women and children)       °[address] °[place], [place] [address] °336-275-0820 °[email or web address] for application and process °Application Required ° °[facility]   °[address]    °[place] [address]     °[phone]       °             °[facility] °[address] °[place], [place] [address] °[phone] °336-235-0363(schedule application appt.) °Application Required ° °[facility] (women only)    °[address]     °[place],  [address]     °336-884-1039      °  Intake starts 6pm daily °Need valid ID, SSC, & Police report °[facility] [place] °[address] °[place], [place] °336-881-5420 °Application Required ° °[facility] (men only)     °[address]      °[place], [place]     °[phone]      ° °[facility] °(Pregnant women only) °[address] °[place], [place] °336-275-0206 ° °[facility]      °[address]      °[place], [place] [address]     °[phone]      °       °[facility] °[address] °[place], [place] °336-723-1848 °90 day commitment/SA/Application process ° °[facility](men only)     °[address]     °[place], [place]     °[phone]       °Check-in at 7pm     °       °[facility] °[address] °[place], [place] [address] °336-248-6684 °Men/Women/Women and Children must be there by 7 pm ° °[facility] °[place], [place] °336-722-8721                ° °

## 2016-12-26 IMAGING — DX DG CHEST 2V
2 series · 2 of 2 positions shown · non-contrast
Comparison: None.

CLINICAL DATA: Chest pain starting 2 hours ago.

EXAM:
CHEST  2 VIEW

[chest pa]
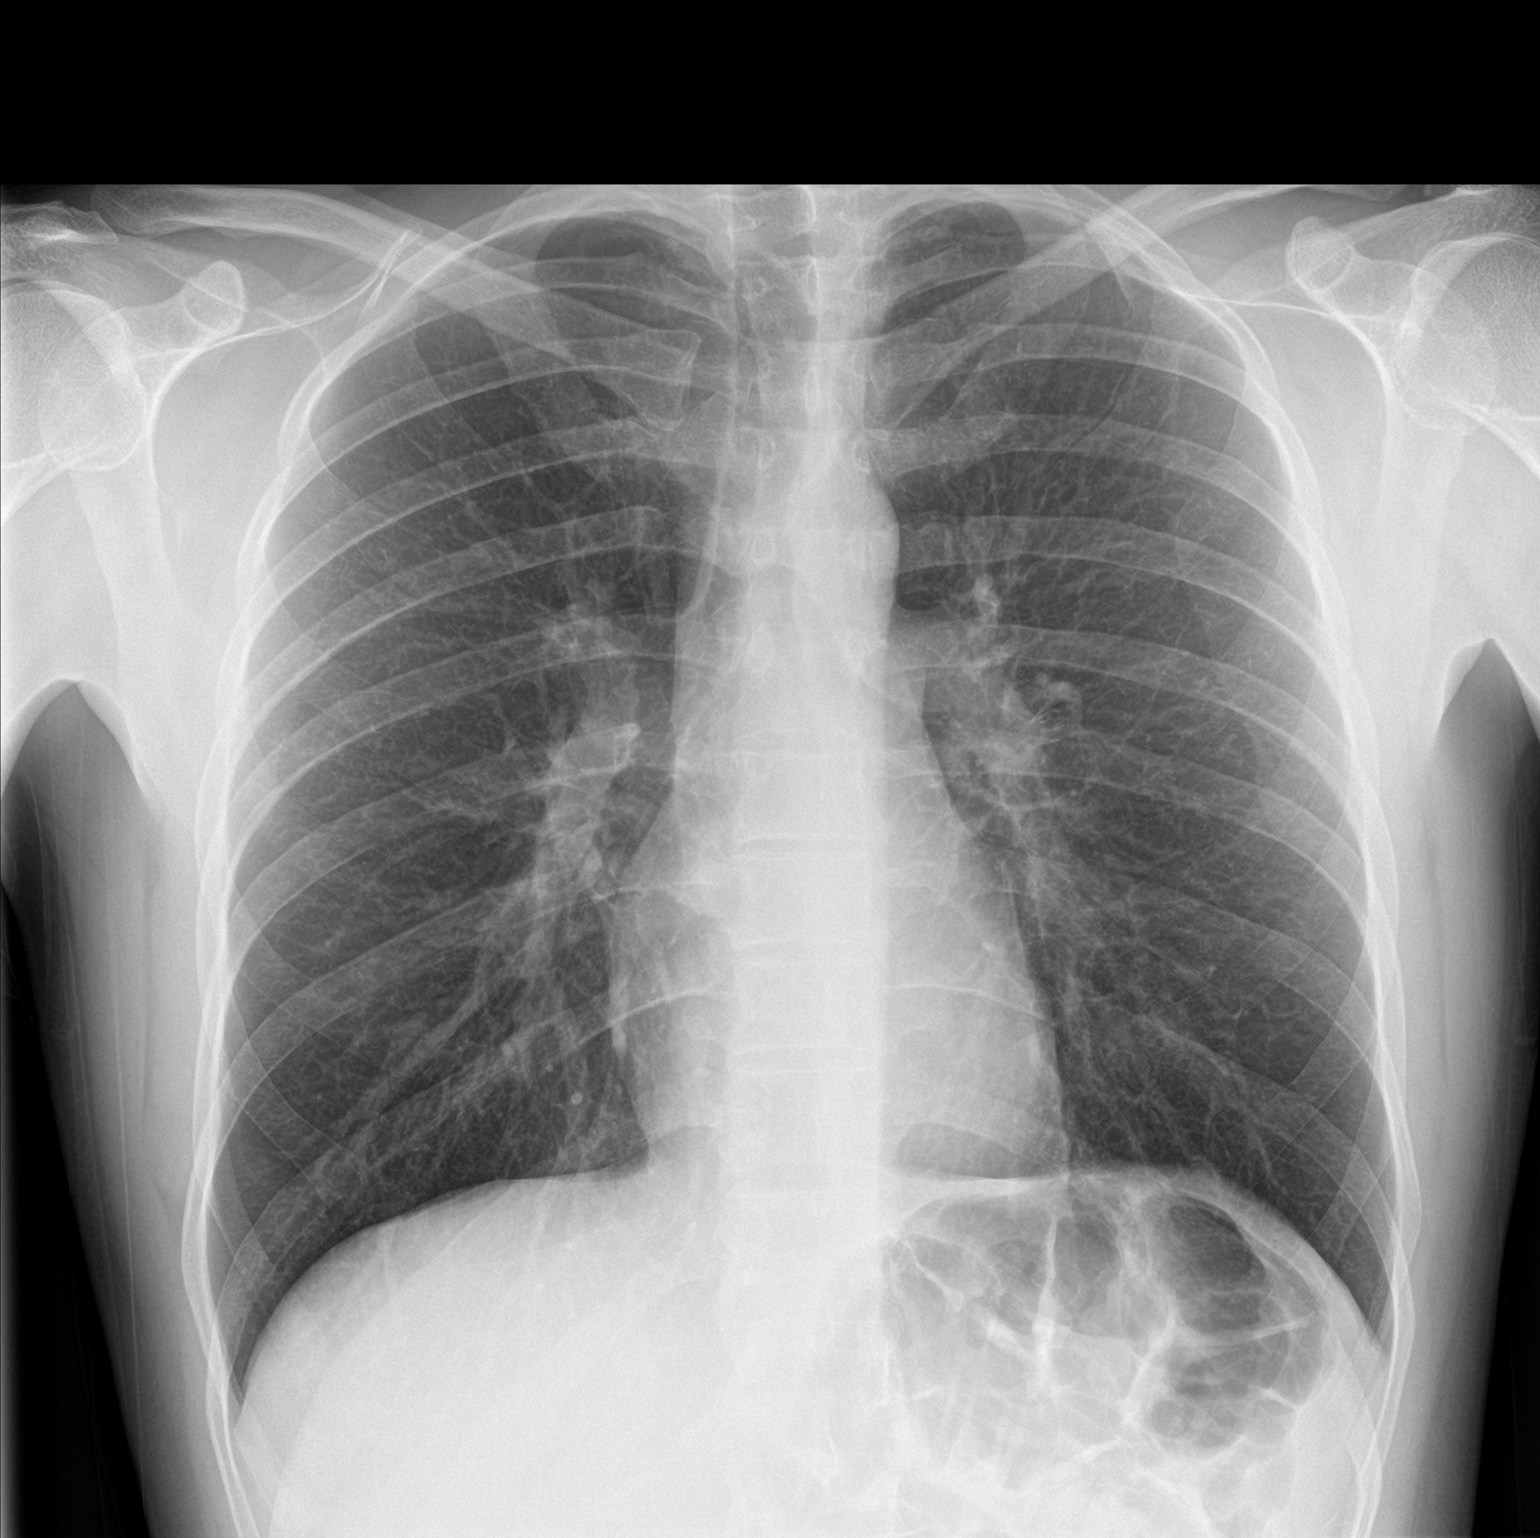

[chest lat]
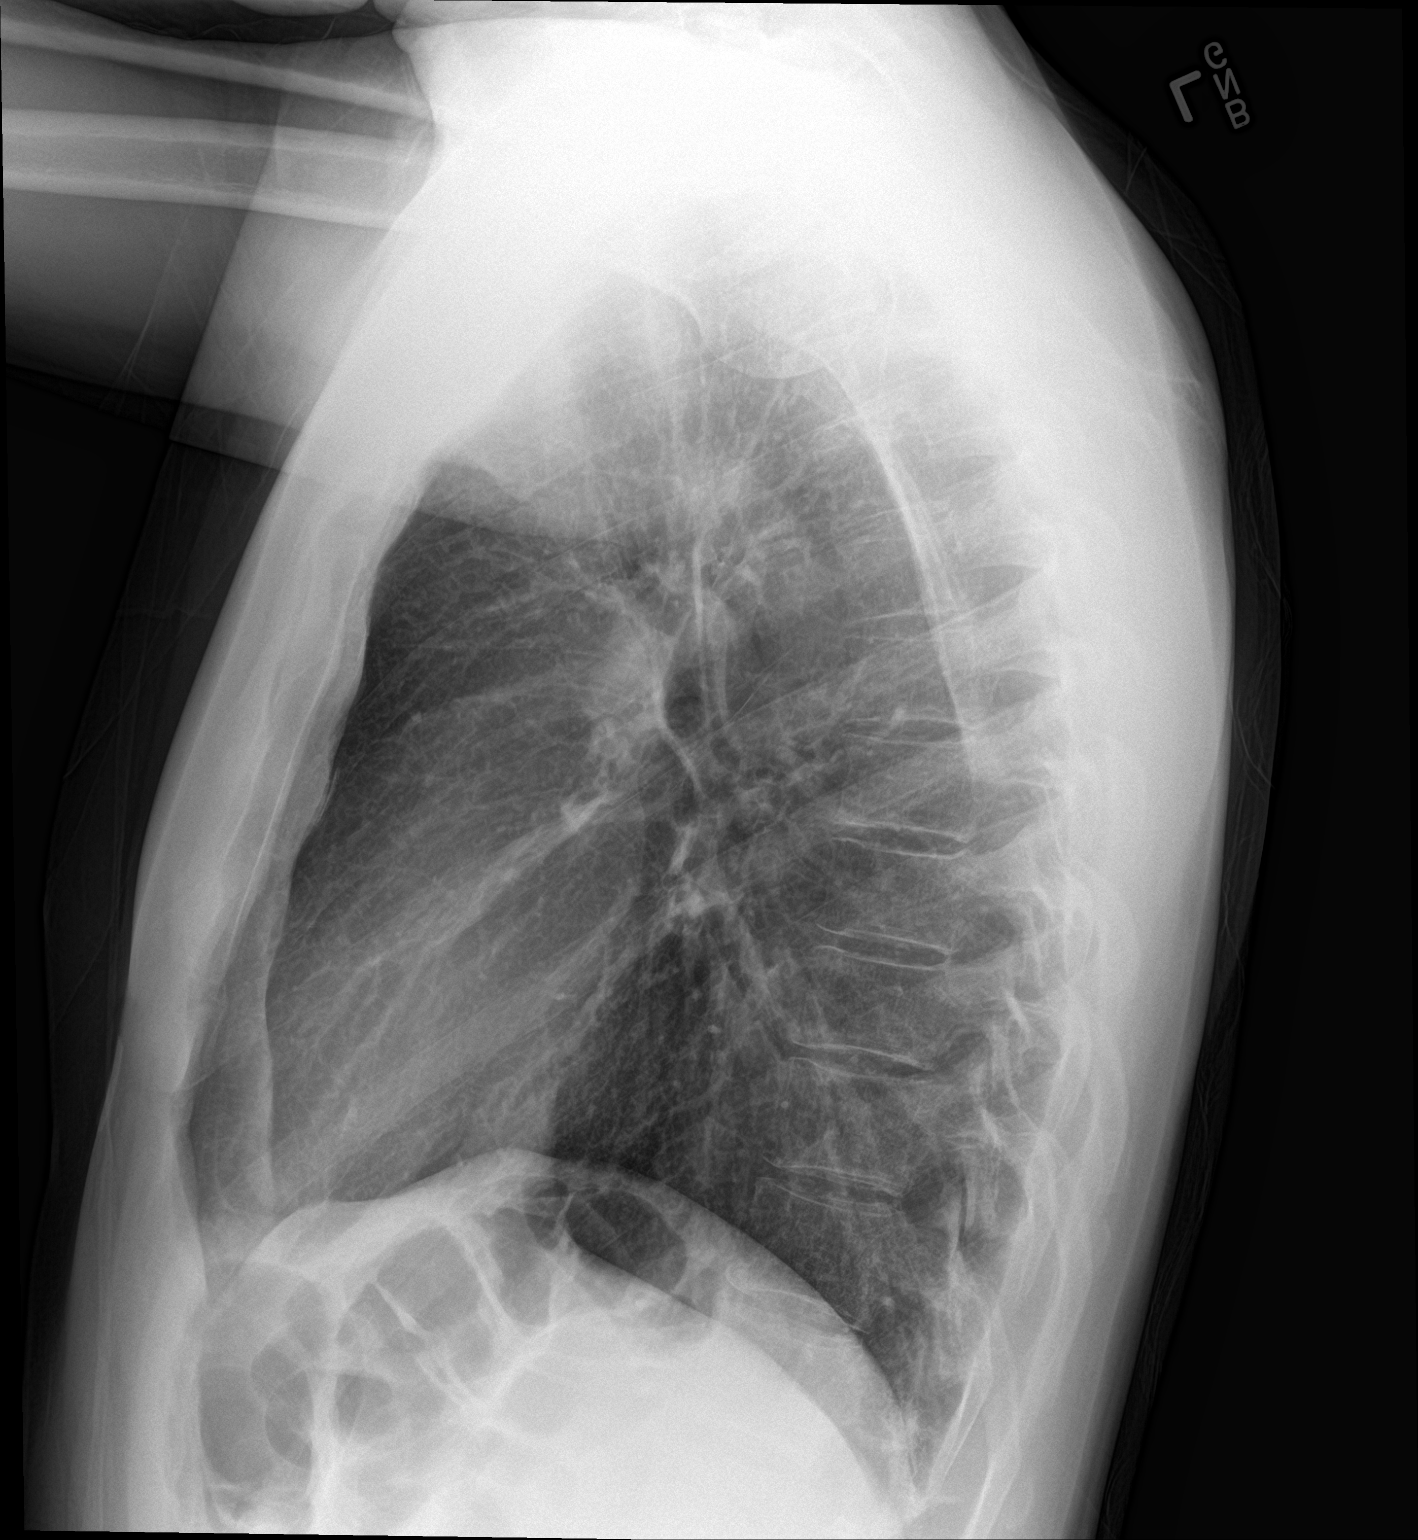

[2 of 2 positions shown; findings below may reference images not displayed]

FINDINGS: Cardiomediastinal silhouette is normal in size and configuration.
Lungs are clear. Lung volumes are normal. No evidence of pneumonia.
No pleural effusion. No pneumothorax.

Osseous and soft tissue structures about the chest are unremarkable.
IMPRESSION: Normal chest x-ray.

## 2020-02-16 ENCOUNTER — Encounter (HOSPITAL_COMMUNITY): Payer: Self-pay

## 2020-02-16 ENCOUNTER — Emergency Department (HOSPITAL_COMMUNITY)
Admission: EM | Admit: 2020-02-16 | Discharge: 2020-02-16 | Disposition: A | Discharge status: Home / Self Care | Payer: Self-pay | Attending: Emergency Medicine | Admitting: Emergency Medicine

## 2020-02-16 DIAGNOSIS — F1590 Other stimulant use, unspecified, uncomplicated: Secondary | ICD-10-CM | POA: Insufficient documentation

## 2020-02-16 DIAGNOSIS — F17210 Nicotine dependence, cigarettes, uncomplicated: Secondary | ICD-10-CM | POA: Insufficient documentation

## 2020-02-16 DIAGNOSIS — Z760 Encounter for issue of repeat prescription: Secondary | ICD-10-CM | POA: Insufficient documentation

## 2020-02-16 MED ORDER — LEVOTHYROXINE SODIUM 175 MCG PO TABS: 175.0000 ug | ORAL_TABLET | Freq: Every day | ORAL | 0 refills | Status: DC

## 2020-02-16 NOTE — ED Provider Notes (Signed)
[facility]-EMERGENCY DEPT Provider Note   CSN: [id number] Arrival date & time: 02/16/20  1432     History Chief Complaint  Patient presents with  . Medication Refill    [name] is a 39 y.o. male with a past medical history significant for polysubstance abuse who presents to the ED requesting a medication refill on his Synthroid.  Patient states he has been out of his medication for roughly 3 weeks.  He admits to increased fatigue, hot flashes, and decreased appetite over the past 3 weeks since stopping his medication.  Denies fever and chills.  Denies sick contacts or known Covid exposures.  Denies chest pain and shortness of breath.  Denies abdominal pain, nausea, vomiting, diarrhea.  No treatment prior to arrival.  No aggravating or alleviating factors.    Past Medical History:  Diagnosis Date  . Back pain   . Opiate abuse, continuous [facility])     Patient Active Problem List   Diagnosis Date Noted  . Substance induced mood disorder (HCC) 04/20/2015  . Polysubstance (including opioids) dependence with physiological dependence (HCC) 04/20/2015  . Mood disorder (HCC) 04/19/2015    Past Surgical History:  Procedure Laterality Date  . FOOT SURGERY    . TONSILLECTOMY         History reviewed. No pertinent family history.  Social History   Tobacco Use  . Smoking status: Current Every Day Smoker    Packs/day: 1.00    Types: Cigarettes  . Smokeless tobacco: Never Used  Substance Use Topics  . Alcohol use: No    Comment: denies  . Drug use: Yes    Types: Marijuana    Comment: cocaine x1 week ago, marijuana today- denies- heroin use on 02/23/16.    Home Medications Prior to Admission medications   Medication Sig Start Date End Date Taking? Authorizing Provider  levothyroxine (SYNTHROID) 175 MCG tablet Take 1 tablet (175 mcg total) by mouth daily before breakfast. 02/16/20 03/17/20 Yes Sapir Lavey, Merla Riches, PA-C  amoxicillin (AMOXIL) 500 MG  capsule Take 1 capsule (500 mg total) by mouth 3 (three) times daily. 02/02/16   Triplett, Tammy, PA-C  ibuprofen (ADVIL,MOTRIN) 200 MG tablet Take 400 mg by mouth every 6 (six) hours as needed.    [provider]  Isopropyl Alcohol (SWIMMERS EAR DROPS OT) Place 2 drops in ear(s) 2 (two) times daily.    [provider]  naloxone Jonelle Sports) 0.4 MG/ML injection Inject 0.4mg  IM X 1 for drug overdose 02/23/16   Eber Hong, MD  naproxen (NAPROSYN) 500 MG tablet Take 1 tablet (500 mg total) by mouth 2 (two) times daily with a meal. 02/02/16   Triplett, Tammy, PA-C    Allergies    Patient has no known allergies.  Review of Systems   Review of Systems  Constitutional: Positive for appetite change and fatigue. Negative for chills and fever.  Respiratory: Negative for shortness of breath.   Cardiovascular: Negative for chest pain.  Gastrointestinal: Negative for abdominal pain, diarrhea, nausea and vomiting.  Neurological: Negative for headaches.  All other systems reviewed and are negative.   Physical Exam Updated Vital Signs BP (!) 141/87   Pulse (!) 105   Temp 98.9 F (37.2 C)   Resp 18   SpO2 97%   Physical Exam Vitals and nursing note reviewed.  Constitutional:      General: He is not in acute distress.    Appearance: He is not ill-appearing.  HENT:     Head:  Normocephalic.  Eyes:     Pupils: Pupils are equal, round, and reactive to light.  Cardiovascular:     Rate and Rhythm: Normal rate and regular rhythm.     Pulses: Normal pulses.     Heart sounds: Normal heart sounds. No murmur heard. No friction rub. No gallop.   Pulmonary:     Effort: Pulmonary effort is normal.     Breath sounds: Normal breath sounds.  Abdominal:     General: Abdomen is flat. There is no distension.     Palpations: Abdomen is soft.     Tenderness: There is no abdominal tenderness. There is no guarding or rebound.  Musculoskeletal:     Cervical back: Neck supple.     Comments:  Able to move all 4 extremities without difficulty.  Skin:    General: Skin is warm and dry.  Neurological:     General: No focal deficit present.     Mental Status: He is alert.     Comments: Normal speech. No facial droop. Cranial nerves grossly intact.   Psychiatric:        Mood and Affect: Mood normal.        Behavior: Behavior normal.     ED Results / Procedures / Treatments   Labs (all labs ordered are listed, but only abnormal results are displayed) Labs Reviewed - No data to display  EKG None  Radiology No results found.  Procedures Procedures (including critical care time)  Medications Ordered in ED Medications - No data to display  ED Course  I have reviewed the triage vital signs and the nursing notes.  Pertinent labs & imaging results that were available during my care of the patient were reviewed by me and considered in my medical decision making (see chart for details).    MDM Rules/Calculators/A&P                         39 year old male presents to the ED requesting a medication refill of his Synthroid.  Patient notes he has been out of it for 3 weeks.  Admits to feeling increased fatigue and decreased appetite.  Upon arrival, stable vitals.  Patient in no acute distress and non-ill-appearing.  Physical exam reassuring. Normal neurological exam. Offered thyroid lab work, but patient deferred. Synthroid refilled.  Instructed patient to follow-up with PCP within the next week for further evaluation. Strict ED precautions discussed with patient. Patient states understanding and agrees to plan. Patient discharged home in no acute distress and stable vitals  Discussed case with Dr. Renaye Rakers who agrees with assessment plan. Final Clinical Impression(s) / ED Diagnoses Final diagnoses:  Medication refill    Rx / DC Orders ED Discharge Orders         Ordered    levothyroxine (SYNTHROID) 175 MCG tablet  Daily before breakfast        02/16/20 1858            [name] 02/16/20 1915    Terald Sleeper, MD 02/16/20 2251

## 2020-02-16 NOTE — Discharge Instructions (Signed)
As discussed, I have refilled your Synthroid.  Take as prescribed.  Please call your PCP tomorrow to schedule appointment for further evaluation.  Return to the ER for new or worsening symptoms.

## 2020-02-16 NOTE — ED Triage Notes (Signed)
Pt presents with c/o request for a medication refill. EMS reports pt has been out of his thyroid med for approx one month and feels generally unwell because of this.

## 2020-08-18 ENCOUNTER — Ambulatory Visit: Payer: Self-pay | Admitting: Internal Medicine

## 2020-09-06 ENCOUNTER — Ambulatory Visit: Payer: Self-pay | Admitting: Internal Medicine

## 2020-09-26 ENCOUNTER — Other Ambulatory Visit: Payer: Self-pay

## 2020-09-26 ENCOUNTER — Encounter: Payer: Self-pay | Admitting: Internal Medicine

## 2020-09-26 ENCOUNTER — Ambulatory Visit (INDEPENDENT_AMBULATORY_CARE_PROVIDER_SITE_OTHER): Payer: Managed Care, Other (non HMO) | Admitting: Internal Medicine

## 2020-09-26 VITALS — BP 128/87 | HR 64 | Temp 97.4°F | Resp 18 | Ht 77.5 in | Wt 217.0 lb

## 2020-09-26 DIAGNOSIS — Z720 Tobacco use: Secondary | ICD-10-CM

## 2020-09-26 DIAGNOSIS — Z7689 Persons encountering health services in other specified circumstances: Secondary | ICD-10-CM | POA: Insufficient documentation

## 2020-09-26 DIAGNOSIS — E039 Hypothyroidism, unspecified: Secondary | ICD-10-CM | POA: Diagnosis not present

## 2020-09-26 DIAGNOSIS — A6002 Herpesviral infection of other male genital organs: Secondary | ICD-10-CM | POA: Insufficient documentation

## 2020-09-26 DIAGNOSIS — Z1159 Encounter for screening for other viral diseases: Secondary | ICD-10-CM

## 2020-09-26 MED ORDER — VALACYCLOVIR HCL 1 G PO TABS: 1000.0000 mg | ORAL_TABLET | Freq: Two times a day (BID) | ORAL | 0 refills | Status: AC

## 2020-09-26 NOTE — Assessment & Plan Note (Signed)
Smokes about 0.5 pack/day ? ?Asked about quitting: confirms that he currently smokes cigarettes ?Advise to quit smoking: Educated about QUITTING to reduce the risk of cancer, cardio and cerebrovascular disease. ?Assess willingness: Unwilling to quit at this time, but is working on cutting back. ?Assist with counseling and pharmacotherapy: Counseled for 5 minutes and literature provided. ?Arrange for follow up: Follow up in 3 months and continue to offer help. ?

## 2020-09-26 NOTE — Assessment & Plan Note (Signed)
Valacyclovir for acute flares If recurrent, will add ppx dose

## 2020-09-26 NOTE — Assessment & Plan Note (Signed)
Care established Previous chart reviewed History and medications reviewed with the patient 

## 2020-09-26 NOTE — Patient Instructions (Addendum)
Fasting blood tests today.  Please take Valacyclovir if you have outbreak of herpes rash.  Please try to cut down -> quit smoking.  Please consider getting a booster dose of COVID vaccine.

## 2020-09-26 NOTE — Assessment & Plan Note (Signed)
Was started on Levothyroxine 175 mcg QD by previous PCP about 1 year ago Has run out of Levothyroxine for about 3 weeks Check TSH and free T4

## 2020-09-26 NOTE — Progress Notes (Signed)
New Patient Office Visit  Subjective:  Patient ID: [name], male    DOB: [birth year]  Age: 40 y.o. MRN: [id number]  CC:  Chief Complaint  Patient presents with   New Patient (Initial Visit)    New patient needs refill on levothyroxine has not seen a provider in a while     HPI [name] is a 40 year old male with past medical history of hypothyroidism, herpes genitalis and polysubstance abuse who presents for establishing care.  He had been levothyroxine 175 mcg for hypothyroidism.  He has run out of it for about 3 weeks.  Denies any constipation, diarrhea, vision problems, leg edema, or any recent change in weight or appetite.  He has a history of herpes genitalis, for which he used to take Valacyclovir.  He reports multiple episodes of flareups in the past, but unable to provide further detail.  Denies any rash currently.  He smokes about 0.5 pack/day.  She also has a history of opioid abuse, but denies using them now.  He has had 2 doses of COVID-vaccine.  Past Medical History:  Diagnosis Date   Back pain    Opiate abuse, continuous ([place])    Substance induced mood disorder ([place]) 04/20/2015    Past Surgical History:  Procedure Laterality Date   FOOT SURGERY     TONSILLECTOMY      History reviewed. No pertinent family history.  Social History   Socioeconomic History   Marital status: Divorced    Spouse name: Not on file   Number of children: Not on file   Years of education: Not on file   Highest education level: Not on file  Occupational History   Not on file  Tobacco Use   Smoking status: Every Day    Packs/day: 1.00    Types: Cigarettes   Smokeless tobacco: Never  Substance and Sexual Activity   Alcohol use: No    Comment: denies   Drug use: Yes    Types: Marijuana    Comment: cocaine x1 week ago, marijuana today- denies- heroin use on 02/23/16.   Sexual activity: Not on file  Other Topics Concern   Not on file  Social History Narrative    Not on file   Social Determinants of Health   Financial Resource Strain: Not on file  Food Insecurity: Not on file  Transportation Needs: Not on file  Physical Activity: Not on file  Stress: Not on file  Social Connections: Not on file  Intimate Partner Violence: Not on file    ROS Review of Systems  Constitutional:  Negative for chills and fever.  HENT:  Negative for congestion and sore throat.   Eyes:  Negative for pain and discharge.  Respiratory:  Negative for cough and shortness of breath.   Cardiovascular:  Negative for chest pain and palpitations.  Gastrointestinal:  Negative for constipation, diarrhea, nausea and vomiting.  Endocrine: Negative for polydipsia and polyuria.  Genitourinary:  Negative for dysuria and hematuria.  Musculoskeletal:  Negative for neck pain and neck stiffness.  Skin:  Negative for rash.  Neurological:  Negative for dizziness, weakness, numbness and headaches.  Psychiatric/Behavioral:  Negative for agitation and behavioral problems.    Objective:   Today's Vitals: BP 128/87 (BP Location: Left Arm, Patient Position: Sitting, Cuff Size: Normal)   Pulse 64   Temp (!) 97.4 F (36.3 C) (Oral)   Resp 18   Ht 6' 5.5" (1.969 m)   Wt 217 lb (98.4 kg)  SpO2 97%   BMI 25.40 kg/m   Physical Exam Vitals reviewed.  Constitutional:      General: He is not in acute distress.    Appearance: He is not diaphoretic.  HENT:     Head: Normocephalic and atraumatic.     Nose: Nose normal.     Mouth/Throat:     Mouth: Mucous membranes are moist.  Eyes:     General: No scleral icterus.    Extraocular Movements: Extraocular movements intact.  Cardiovascular:     Rate and Rhythm: Normal rate and regular rhythm.     Pulses: Normal pulses.     Heart sounds: Normal heart sounds. No murmur heard. Pulmonary:     Breath sounds: Normal breath sounds. No wheezing or rales.  Abdominal:     Palpations: Abdomen is soft.     Tenderness: There is no abdominal  tenderness.  Musculoskeletal:     Cervical back: Neck supple. No tenderness.     Right lower leg: No edema.     Left lower leg: No edema.  Skin:    General: Skin is warm.     Findings: No rash.  Neurological:     General: No focal deficit present.     Mental Status: He is alert and oriented to person, place, and time.  Psychiatric:        Mood and Affect: Mood normal.        Behavior: Behavior normal.    Assessment & Plan:   Problem List Items Addressed This Visit       Encounter to establish care - Primary   Care established Previous chart reviewed History and medications reviewed with the patient      Relevant Orders  CBC with Differential/Platelet  CMP14+EGFR  Lipid panel    Endocrine   Hypothyroidism    Was started on Levothyroxine 175 mcg QD by previous PCP about 1 year ago Has run out of Levothyroxine for about 3 weeks Check TSH and free T4       Relevant Orders   TSH + free T4     Genitourinary   Herpes genitalis in men    Valacyclovir for acute flares If recurrent, will add ppx dose       Relevant Medications   valACYclovir (VALTREX) 1000 MG tablet     Other   Other Visit Diagnoses     Tobacco abuse Smokes about 0.5 pack/day  Asked about quitting: confirms that he currently smokes cigarettes Advise to quit smoking: Educated about QUITTING to reduce the risk of cancer, cardio and cerebrovascular disease. Assess willingness: Unwilling to quit at this time, but is working on cutting back. Assist with counseling and pharmacotherapy: Counseled for 5 minutes and literature provided. Arrange for follow up: Follow up in 3 months and continue to offer help.    Need for hepatitis C screening test       Relevant Orders   Hepatitis C Antibody   Encounter for screening for HIV       Relevant Medications   valACYclovir (VALTREX) 1000 MG tablet       Outpatient Encounter Medications as of 09/26/2020  Medication Sig   ibuprofen (ADVIL,MOTRIN)  200 MG tablet Take 400 mg by mouth every 6 (six) hours as needed.   naproxen (NAPROSYN) 500 MG tablet Take 1 tablet (500 mg total) by mouth 2 (two) times daily with a meal.   valACYclovir (VALTREX) 1000 MG tablet Take 1 tablet (1,000 mg total) by mouth 2 (two)  times daily for 10 days.   levothyroxine (SYNTHROID) 175 MCG tablet Take 1 tablet (175 mcg total) by mouth daily before breakfast.   [DISCONTINUED] amoxicillin (AMOXIL) 500 MG capsule Take 1 capsule (500 mg total) by mouth 3 (three) times daily. (Patient not taking: Reported on 09/26/2020)   [DISCONTINUED] Isopropyl Alcohol (SWIMMERS EAR DROPS OT) Place 2 drops in ear(s) 2 (two) times daily. (Patient not taking: Reported on 09/26/2020)   [DISCONTINUED] naloxone (NARCAN) 0.4 MG/ML injection Inject 0.4mg  IM X 1 for drug overdose (Patient not taking: Reported on 09/26/2020)   No facility-administered encounter medications on file as of 09/26/2020.    Follow-up: Return in about 3 months (around 12/27/2020) for Hypothyroidism.   Lindell Spar, MD

## 2020-09-27 ENCOUNTER — Other Ambulatory Visit: Payer: Self-pay | Admitting: Internal Medicine

## 2020-09-27 DIAGNOSIS — E039 Hypothyroidism, unspecified: Secondary | ICD-10-CM

## 2020-09-27 DIAGNOSIS — R768 Other specified abnormal immunological findings in serum: Secondary | ICD-10-CM

## 2020-09-27 LAB — CMP14+EGFR
ALT: 16 IU/L (ref 0–44)
AST: 24 IU/L (ref 0–40)
Albumin/Globulin Ratio: 2.1 (ref 1.2–2.2)
Albumin: 4.2 g/dL (ref 4.0–5.0)
Alkaline Phosphatase: 65 IU/L (ref 44–121)
BUN/Creatinine Ratio: 15 (ref 9–20)
BUN: 13 mg/dL (ref 6–20)
Bilirubin Total: 0.2 mg/dL (ref 0.0–1.2)
CO2: 24 mmol/L (ref 20–29)
Calcium: 9 mg/dL (ref 8.7–10.2)
Chloride: 101 mmol/L (ref 96–106)
Creatinine, Ser: 0.86 mg/dL (ref 0.76–1.27)
Globulin, Total: 2 g/dL (ref 1.5–4.5)
Glucose: 83 mg/dL (ref 65–99)
Potassium: 4.3 mmol/L (ref 3.5–5.2)
Sodium: 139 mmol/L (ref 134–144)
Total Protein: 6.2 g/dL (ref 6.0–8.5)
eGFR: 114 mL/min/{1.73_m2} (ref >59)

## 2020-09-27 LAB — LIPID PANEL
Chol/HDL Ratio: 2.5 ratio (ref 0.0–5.0)
Cholesterol, Total: 157 mg/dL (ref 100–199)
HDL: 62 mg/dL (ref >39)
LDL Chol Calc (NIH): 77 mg/dL (ref 0–99)
Triglycerides: 102 mg/dL (ref 0–149)
VLDL Cholesterol Cal: 18 mg/dL (ref 5–40)

## 2020-09-27 LAB — CBC WITH DIFFERENTIAL/PLATELET
Basophils Absolute: 0.1 10*3/uL (ref 0.0–0.2)
Basos: 1 %
EOS (ABSOLUTE): 0.2 10*3/uL (ref 0.0–0.4)
Eos: 3 %
Hematocrit: 43.7 % (ref 37.5–51.0)
Hemoglobin: 14.5 g/dL (ref 13.0–17.7)
Immature Grans (Abs): 0 10*3/uL (ref 0.0–0.1)
Immature Granulocytes: 0 %
Lymphocytes Absolute: 2.5 10*3/uL (ref 0.7–3.1)
Lymphs: 37 %
MCH: 30.8 pg (ref 26.6–33.0)
MCHC: 33.2 g/dL (ref 31.5–35.7)
MCV: 93 fL (ref 79–97)
Monocytes Absolute: 0.5 10*3/uL (ref 0.1–0.9)
Monocytes: 8 %
Neutrophils Absolute: 3.4 10*3/uL (ref 1.4–7.0)
Neutrophils: 51 %
Platelets: 202 10*3/uL (ref 150–450)
RBC: 4.71 x10E6/uL (ref 4.14–5.80)
RDW: 12.7 % (ref 11.6–15.4)
WBC: 6.7 10*3/uL (ref 3.4–10.8)

## 2020-09-27 LAB — TSH+FREE T4
Free T4: 0.82 ng/dL (ref 0.82–1.77)
TSH: 8.27 u[IU]/mL — ABNORMAL HIGH (ref 0.450–4.500)

## 2020-09-27 LAB — HEPATITIS C ANTIBODY: Hep C Virus Ab: >11 s/co ratio — ABNORMAL HIGH (ref 0.0–0.9)

## 2020-09-27 MED ORDER — LEVOTHYROXINE SODIUM 175 MCG PO TABS: 175.0000 ug | ORAL_TABLET | Freq: Every day | ORAL | 2 refills | Status: AC

## 2020-10-01 LAB — SPECIMEN STATUS REPORT

## 2020-10-01 LAB — HCV RNA QUANT RFLX ULTRA OR GENOTYP: HCV Quant Baseline: NOT DETECTED IU/mL

## 2020-10-11 ENCOUNTER — Ambulatory Visit: Payer: Self-pay | Admitting: Internal Medicine

## 2020-12-27 ENCOUNTER — Ambulatory Visit: Payer: Managed Care, Other (non HMO) | Admitting: Internal Medicine

## 2021-03-25 ENCOUNTER — Emergency Department (HOSPITAL_COMMUNITY): Payer: Self-pay

## 2021-03-25 ENCOUNTER — Emergency Department (HOSPITAL_COMMUNITY)
Admission: EM | Admit: 2021-03-25 | Discharge: 2021-03-25 | Disposition: A | Discharge status: Home / Self Care | Payer: Self-pay | Attending: Emergency Medicine | Admitting: Emergency Medicine

## 2021-03-25 ENCOUNTER — Encounter (HOSPITAL_COMMUNITY): Payer: Self-pay | Admitting: *Deleted

## 2021-03-25 DIAGNOSIS — G5630 Lesion of radial nerve, unspecified upper limb: Secondary | ICD-10-CM

## 2021-03-25 DIAGNOSIS — F1910 Other psychoactive substance abuse, uncomplicated: Secondary | ICD-10-CM | POA: Insufficient documentation

## 2021-03-25 DIAGNOSIS — G5632 Lesion of radial nerve, left upper limb: Secondary | ICD-10-CM | POA: Insufficient documentation

## 2021-03-25 LAB — BASIC METABOLIC PANEL
Anion gap: 7 (ref 5–15)
BUN: 6 mg/dL (ref 6–20)
CO2: 32 mmol/L (ref 22–32)
Calcium: 8.7 mg/dL — ABNORMAL LOW (ref 8.9–10.3)
Chloride: 95 mmol/L — ABNORMAL LOW (ref 98–111)
Creatinine, Ser: 0.85 mg/dL (ref 0.61–1.24)
GFR, Estimated: >60 mL/min (ref >60)
Glucose, Bld: 79 mg/dL (ref 70–99)
Potassium: 3.3 mmol/L — ABNORMAL LOW (ref 3.5–5.1)
Sodium: 134 mmol/L — ABNORMAL LOW (ref 135–145)

## 2021-03-25 LAB — CBC WITH DIFFERENTIAL/PLATELET
Abs Immature Granulocytes: 0.02 10*3/uL (ref 0.00–0.07)
Basophils Absolute: 0 10*3/uL (ref 0.0–0.1)
Basophils Relative: 1 %
Eosinophils Absolute: 0.1 10*3/uL (ref 0.0–0.5)
Eosinophils Relative: 1 %
HCT: 43.3 % (ref 39.0–52.0)
Hemoglobin: 14.6 g/dL (ref 13.0–17.0)
Immature Granulocytes: 0 %
Lymphocytes Relative: 19 %
Lymphs Abs: 1.5 10*3/uL (ref 0.7–4.0)
MCH: 31.2 pg (ref 26.0–34.0)
MCHC: 33.7 g/dL (ref 30.0–36.0)
MCV: 92.5 fL (ref 80.0–100.0)
Monocytes Absolute: 1 10*3/uL (ref 0.1–1.0)
Monocytes Relative: 13 %
Neutro Abs: 5.4 10*3/uL (ref 1.7–7.7)
Neutrophils Relative %: 66 %
Platelets: 270 10*3/uL (ref 150–400)
RBC: 4.68 MIL/uL (ref 4.22–5.81)
RDW: 14.2 % (ref 11.5–15.5)
WBC: 8.1 10*3/uL (ref 4.0–10.5)
nRBC: 0 % (ref 0.0–0.2)

## 2021-03-25 MED ORDER — DOXYCYCLINE HYCLATE 100 MG PO TABS
100.0000 mg | ORAL_TABLET | Freq: Once | ORAL | Status: AC
  Administered 2021-03-25: 100 mg via ORAL
  Filled 2021-03-25: qty 1

## 2021-03-25 MED ORDER — DOXYCYCLINE HYCLATE 100 MG PO CAPS: 100.0000 mg | ORAL_CAPSULE | Freq: Two times a day (BID) | ORAL | 0 refills | Status: AC

## 2021-03-25 NOTE — ED Notes (Signed)
Attempted to call homeless shelter in [place] without success. Home of refuge called at this time without answer. Mom called for patient discharge instructions.

## 2021-03-25 NOTE — ED Notes (Signed)
Left wrist splint applied to patient

## 2021-03-25 NOTE — Discharge Instructions (Addendum)
Information provided above to help with resources for dealing with substance abuse as well as outpatient homes.  You should take doxycycline twice daily for the next 10 days to prevent infection.  Please use the brace, follow-up with neurology for reevaluation in a week or so.

## 2021-03-25 NOTE — ED Triage Notes (Signed)
Left arm pain, states he shoots up drug meth and heroin. Requesting assistance with drug problem. States he is homeless

## 2021-03-25 NOTE — ED Provider Notes (Signed)
[facility] EMERGENCY DEPARTMENT Provider Note   CSN: [id number] Arrival date & time: 03/25/21  1347     History  Chief Complaint  Patient presents with   Drug Problem    [name] is a 41 y.o. male.   Drug Problem   Patient with history of IV substance use disorder (meth, heroin) presents due to left arm pain.  States its been constant for the last 2 days, nothing makes it better or worse.  Feels like his left arm is numb, feels it from the forearm from the elbow down.  This is the area where he usually injects, last IV drug use was yesterday.    Patient is requesting help for substance use disorder.  Requesting resources, currently is without stable housing.  Denies HI or SI.  Home Medications Prior to Admission medications   Medication Sig Start Date End Date Taking? Authorizing Provider  doxycycline (VIBRAMYCIN) 100 MG capsule Take 1 capsule (100 mg total) by mouth 2 (two) times daily. 03/25/21  Yes Theron Arista, PA-C  ibuprofen (ADVIL,MOTRIN) 200 MG tablet Take 400 mg by mouth every 6 (six) hours as needed.    [provider]  levothyroxine (SYNTHROID) 175 MCG tablet Take 1 tablet (175 mcg total) by mouth daily before breakfast. 09/27/20   Anabel Halon, MD  naproxen (NAPROSYN) 500 MG tablet Take 1 tablet (500 mg total) by mouth 2 (two) times daily with a meal. 02/02/16   Triplett, Tammy, PA-C      Allergies    Penicillin g    Review of Systems   Review of Systems  Musculoskeletal:  Positive for myalgias.   Physical Exam Updated Vital Signs BP 136/85 (BP Location: Right Arm)    Pulse 70    Temp (!) 97.3 F (36.3 C) (Oral)    Resp 18    Wt 90.5 kg    SpO2 99%    BMI 23.36 kg/m  Physical Exam Vitals and nursing note reviewed. Exam conducted with a chaperone present.  Constitutional:      General: He is not in acute distress.    Appearance: Normal appearance.  HENT:     Head: Normocephalic and atraumatic.  Eyes:     General: No scleral icterus.     Extraocular Movements: Extraocular movements intact.     Pupils: Pupils are equal, round, and reactive to light.  Cardiovascular:     Pulses: Normal pulses.     Comments: Radial pulse 2+ bilaterally Musculoskeletal:        General: Tenderness present.  Skin:    Capillary Refill: Capillary refill takes 2 to 3 seconds.     Coloration: Skin is pale. Skin is not jaundiced.     Comments: Track marks to the antecubital space left  Neurological:     Mental Status: He is alert. Mental status is at baseline.     Coordination: Coordination normal.     Comments: Wrist drop left    ED Results / Procedures / Treatments   Labs (all labs ordered are listed, but only abnormal results are displayed) Labs Reviewed  BASIC METABOLIC PANEL - Abnormal; Notable for the following components:      Result Value   Sodium 134 (*)    Potassium 3.3 (*)    Chloride 95 (*)    Calcium 8.7 (*)    All other components within normal limits  CBC WITH DIFFERENTIAL/PLATELET    EKG None  Radiology DG Forearm Left  Result Date: 03/25/2021 CLINICAL  DATA:  LEFT arm pain. History of meth and heroin injections. EXAM: LEFT FOREARM - 2 VIEW COMPARISON:  None. FINDINGS: Osseous alignment is normal. No focal cortical irregularity or osseous lesion. No evidence of osteomyelitis. Soft tissues of the forearm are unremarkable. No soft tissue gas seen. IMPRESSION: Negative. Electronically Signed   By: Bary Richard M.D.   On: 03/25/2021 14:45    Procedures Procedures    Medications Ordered in ED Medications  doxycycline (VIBRA-TABS) tablet 100 mg (has no administration in time range)    ED Course/ Medical Decision Making/ A&P                           Medical Decision Making Amount and/or Complexity of Data Reviewed Labs: ordered. Radiology: ordered.  Risk Prescription drug management.   This patient presents to the ED for concern of left arm pain, this involves an extensive number of treatment options, and is  a complaint that carries with it a high risk of complications and morbidity.  The differential diagnosis includes abscess, extremity clot, muscle strain, cellulitis, other   Co morbidities that complicate the patient evaluation: IVDU   Additional history obtained: -Additional history obtained from chart review.  I reviewed the patient's visit from earlier this week when he was admitted due to accidental overdose.  Leukocytosis or anemia. -External records from outside source obtained and reviewed including: Chart review including previous notes, labs, imaging, consultation notes   Lab Tests: -I ordered, reviewed, and interpreted labs.  The pertinent results include: No leukocytosis or anemia.  No gross electrolyte derangement, mild hypokalemia at 3.3.  Mild hyponatremia 134.   Imaging Studies ordered: -I ordered imaging studies including forearm x-ray -I independently visualized and interpreted imaging which showed no soft tissue swelling, no retained needles -I agree with the radiologist interpretation   Medicines ordered and prescription drug management: -I ordered medication including wrist splint  and doxycycline for presumed infection risk and wrist drop  -Reevaluation of the patient after these medicines showed that the patient improved -I have reviewed the patients home medicines and have made adjustments as needed   ED Course: Patient with history of IV drug abuse presenting due to left arm pain and numbness.  He has strong radial pulse, cap refill slightly delayed at 2 to 3 seconds but not significantly so.  Compartments are soft, doubt compartment syndrome.  There is no overlying skin discoloration concerning for an acute cellulitis, additionally the skin is not warm to touch.  Given history of IV drug use however I would err on the side of covering for infectious process by giving the patient a course of antibiotics.  Patient also has wrist drop on physical exam notable for  radial nerve palsy secondary to injection.  X-ray does not show any retained needles or foreign bodies. Plan is to put the patient in a wrist splint.  We will also give doxycycline empirically to treat for any infections.  Patient encouraged to follow-up with substance abuse resources, do not think he needs inpatient admission given vitals are stable, no SI or HI.  Patient verbalized agreement with the plan.   Reevaluation: After the interventions noted above, I reevaluated the patient and found that they have :improved   Dispostion: D/C  Discussed HPI, physical exam and plan of care for this patient with attending Eber Hong. The attending physician evaluated this patient as part of a shared visit and agrees with plan of care.  Final Clinical Impression(s) / ED Diagnoses Final diagnoses:  Radial nerve palsy    Rx / DC Orders ED Discharge Orders          Ordered    doxycycline (VIBRAMYCIN) 100 MG capsule  2 times daily        03/25/21 1524              Theron AristaSage, Chantilly Linskey, PA-C 03/25/21 1542    Eber HongMiller, [place], MD 03/25/21 1558

## 2021-03-27 ENCOUNTER — Ambulatory Visit (HOSPITAL_COMMUNITY)
Admission: AD | Admit: 2021-03-27 | Discharge: 2021-03-27 | Disposition: A | Discharge status: Home / Self Care | Payer: Self-pay | Attending: Psychiatry | Admitting: Psychiatry

## 2021-03-27 DIAGNOSIS — F1994 Other psychoactive substance use, unspecified with psychoactive substance-induced mood disorder: Secondary | ICD-10-CM

## 2021-03-27 DIAGNOSIS — F1120 Opioid dependence, uncomplicated: Secondary | ICD-10-CM | POA: Insufficient documentation

## 2021-03-27 DIAGNOSIS — F1520 Other stimulant dependence, uncomplicated: Secondary | ICD-10-CM

## 2021-03-28 ENCOUNTER — Encounter (HOSPITAL_COMMUNITY): Payer: Self-pay

## 2021-03-28 ENCOUNTER — Other Ambulatory Visit: Payer: Self-pay

## 2021-03-28 ENCOUNTER — Emergency Department (HOSPITAL_COMMUNITY)
Admission: EM | Admit: 2021-03-28 | Discharge: 2021-03-28 | Disposition: A | Discharge status: Home / Self Care | Payer: Self-pay | Attending: Emergency Medicine | Admitting: Emergency Medicine

## 2021-03-28 DIAGNOSIS — F1120 Opioid dependence, uncomplicated: Secondary | ICD-10-CM | POA: Insufficient documentation

## 2021-03-28 DIAGNOSIS — E876 Hypokalemia: Secondary | ICD-10-CM

## 2021-03-28 DIAGNOSIS — R197 Diarrhea, unspecified: Secondary | ICD-10-CM | POA: Insufficient documentation

## 2021-03-28 DIAGNOSIS — F1510 Other stimulant abuse, uncomplicated: Secondary | ICD-10-CM

## 2021-03-28 DIAGNOSIS — F1994 Other psychoactive substance use, unspecified with psychoactive substance-induced mood disorder: Secondary | ICD-10-CM | POA: Insufficient documentation

## 2021-03-28 DIAGNOSIS — M6281 Muscle weakness (generalized): Secondary | ICD-10-CM | POA: Insufficient documentation

## 2021-03-28 DIAGNOSIS — Z20822 Contact with and (suspected) exposure to covid-19: Secondary | ICD-10-CM | POA: Insufficient documentation

## 2021-03-28 DIAGNOSIS — F1520 Other stimulant dependence, uncomplicated: Secondary | ICD-10-CM | POA: Insufficient documentation

## 2021-03-28 DIAGNOSIS — R45851 Suicidal ideations: Secondary | ICD-10-CM | POA: Insufficient documentation

## 2021-03-28 DIAGNOSIS — F1910 Other psychoactive substance abuse, uncomplicated: Secondary | ICD-10-CM

## 2021-03-28 LAB — RAPID URINE DRUG SCREEN, HOSP PERFORMED
Amphetamines: POSITIVE — AB
Barbiturates: NOT DETECTED
Benzodiazepines: NOT DETECTED
Cocaine: NOT DETECTED
Opiates: NOT DETECTED
Tetrahydrocannabinol: POSITIVE — AB

## 2021-03-28 LAB — COMPREHENSIVE METABOLIC PANEL
ALT: 23 U/L (ref 0–44)
AST: 23 U/L (ref 15–41)
Albumin: 3.7 g/dL (ref 3.5–5.0)
Alkaline Phosphatase: 63 U/L (ref 38–126)
Anion gap: 7 (ref 5–15)
BUN: 6 mg/dL (ref 6–20)
CO2: 28 mmol/L (ref 22–32)
Calcium: 8.6 mg/dL — ABNORMAL LOW (ref 8.9–10.3)
Chloride: 100 mmol/L (ref 98–111)
Creatinine, Ser: 0.95 mg/dL (ref 0.61–1.24)
GFR, Estimated: >60 mL/min (ref >60)
Glucose, Bld: 89 mg/dL (ref 70–99)
Potassium: 3.4 mmol/L — ABNORMAL LOW (ref 3.5–5.1)
Sodium: 135 mmol/L (ref 135–145)
Total Bilirubin: 0.3 mg/dL (ref 0.3–1.2)
Total Protein: 6.8 g/dL (ref 6.5–8.1)

## 2021-03-28 LAB — ACETAMINOPHEN LEVEL: Acetaminophen (Tylenol), Serum: <10 ug/mL — ABNORMAL LOW (ref 10–30)

## 2021-03-28 LAB — CBC
HCT: 45 % (ref 39.0–52.0)
Hemoglobin: 14.9 g/dL (ref 13.0–17.0)
MCH: 30.7 pg (ref 26.0–34.0)
MCHC: 33.1 g/dL (ref 30.0–36.0)
MCV: 92.8 fL (ref 80.0–100.0)
Platelets: 279 10*3/uL (ref 150–400)
RBC: 4.85 MIL/uL (ref 4.22–5.81)
RDW: 14.9 % (ref 11.5–15.5)
WBC: 8 10*3/uL (ref 4.0–10.5)
nRBC: 0 % (ref 0.0–0.2)

## 2021-03-28 LAB — SALICYLATE LEVEL: Salicylate Lvl: <7 mg/dL — ABNORMAL LOW (ref 7.0–30.0)

## 2021-03-28 LAB — RESP PANEL BY RT-PCR (FLU A&B, COVID) ARPGX2
Influenza A by PCR: NEGATIVE
Influenza B by PCR: NEGATIVE
SARS Coronavirus 2 by RT PCR: NEGATIVE

## 2021-03-28 LAB — ETHANOL: Alcohol, Ethyl (B): <10 mg/dL (ref <10)

## 2021-03-28 MED ORDER — LORAZEPAM 1 MG PO TABS
1.0000 mg | ORAL_TABLET | ORAL | Status: DC | PRN
  Administered 2021-03-28: 1 mg via ORAL
  Filled 2021-03-28: qty 1

## 2021-03-28 MED ORDER — ACETAMINOPHEN 325 MG PO TABS: 650.0000 mg | ORAL_TABLET | ORAL | Status: DC | PRN

## 2021-03-28 MED ORDER — HYDROXYZINE HCL 25 MG PO TABS
25.0000 mg | ORAL_TABLET | Freq: Three times a day (TID) | ORAL | Status: DC | PRN
  Administered 2021-03-28: 25 mg via ORAL
  Filled 2021-03-28: qty 1

## 2021-03-28 MED ORDER — METHOCARBAMOL 500 MG PO TABS
500.0000 mg | ORAL_TABLET | Freq: Three times a day (TID) | ORAL | Status: DC | PRN
  Administered 2021-03-28: 500 mg via ORAL
  Filled 2021-03-28: qty 1

## 2021-03-28 MED ORDER — CLONIDINE HCL 0.1 MG PO TABS
0.1000 mg | ORAL_TABLET | Freq: Four times a day (QID) | ORAL | Status: DC
  Administered 2021-03-28: 0.1 mg via ORAL
  Filled 2021-03-28: qty 1

## 2021-03-28 MED ORDER — ADULT MULTIVITAMIN W/MINERALS CH
1.0000 | ORAL_TABLET | Freq: Every day | ORAL | Status: DC
  Administered 2021-03-28: 1 via ORAL
  Filled 2021-03-28: qty 1

## 2021-03-28 MED ORDER — DICYCLOMINE HCL 20 MG PO TABS: 20.0000 mg | ORAL_TABLET | Freq: Four times a day (QID) | ORAL | Status: DC | PRN

## 2021-03-28 MED ORDER — CLONIDINE HCL 0.1 MG PO TABS: 0.1000 mg | ORAL_TABLET | Freq: Every day | ORAL | Status: DC

## 2021-03-28 MED ORDER — LORAZEPAM 1 MG PO TABS: 1.0000 mg | ORAL_TABLET | Freq: Four times a day (QID) | ORAL | Status: DC | PRN

## 2021-03-28 MED ORDER — NICOTINE 14 MG/24HR TD PT24: 14.0000 mg | MEDICATED_PATCH | Freq: Every day | TRANSDERMAL | Status: DC

## 2021-03-28 MED ORDER — ONDANSETRON 4 MG PO TBDP: 4.0000 mg | ORAL_TABLET | Freq: Four times a day (QID) | ORAL | Status: DC | PRN

## 2021-03-28 MED ORDER — NAPROXEN 500 MG PO TABS: 500.0000 mg | ORAL_TABLET | Freq: Two times a day (BID) | ORAL | Status: DC | PRN

## 2021-03-28 MED ORDER — THIAMINE HCL 100 MG PO TABS
100.0000 mg | ORAL_TABLET | Freq: Every day | ORAL | Status: DC
  Filled 2021-03-28: qty 1

## 2021-03-28 MED ORDER — POTASSIUM CHLORIDE CRYS ER 20 MEQ PO TBCR
40.0000 meq | EXTENDED_RELEASE_TABLET | Freq: Once | ORAL | Status: AC
  Administered 2021-03-28: 40 meq via ORAL
  Filled 2021-03-28: qty 2

## 2021-03-28 MED ORDER — TRAZODONE HCL 100 MG PO TABS: 50.0000 mg | ORAL_TABLET | Freq: Every evening | ORAL | Status: DC | PRN

## 2021-03-28 MED ORDER — GABAPENTIN 100 MG PO CAPS
400.0000 mg | ORAL_CAPSULE | Freq: Two times a day (BID) | ORAL | Status: DC
  Administered 2021-03-28 (×2): 400 mg via ORAL
  Filled 2021-03-28 (×2): qty 4

## 2021-03-28 MED ORDER — DOXYCYCLINE HYCLATE 100 MG PO TABS
100.0000 mg | ORAL_TABLET | Freq: Two times a day (BID) | ORAL | Status: DC
  Administered 2021-03-28 (×2): 100 mg via ORAL
  Filled 2021-03-28 (×2): qty 1

## 2021-03-28 MED ORDER — LOPERAMIDE HCL 2 MG PO CAPS: 2.0000 mg | ORAL_CAPSULE | ORAL | Status: DC | PRN

## 2021-03-28 MED ORDER — CLONIDINE HCL 0.1 MG PO TABS: 0.1000 mg | ORAL_TABLET | ORAL | Status: DC

## 2021-03-28 NOTE — ED Notes (Signed)
Pt has been wanded by security, belongings placed in 9-12 pt belongings cabinets (3 bags).

## 2021-03-28 NOTE — Consult Note (Signed)
[facility] Psych ED Discharge  03/28/2021 11:09 AM [name]  MRN:  [id number]  Method of visit?: Face to Face   Principal Problem: Substance induced mood disorder [facility]) Discharge Diagnoses: Principal Problem:   Substance induced mood disorder ([place]) Active Problems:   Opioid use disorder, severe, dependence (HCC)   Methamphetamine use disorder, severe (HCC)  Subjective: 41 year old male returns with recurrent suicidal thoughts and alcohol intoxication. He has numerous similar presentations which are concerning for malingering behavior, and he was too intoxicated to discharge home yesterday.  He has presented with 2 recent incidental overdose on methamphetamine laced with fentanyl , on March 21, 2021.  He does endorse fleeting suicidal thoughts related to his substance use.  "I say suicidal things when I am under the influence.  But I am not suicidal at this time." . However he denies any suicidal ideations at this time. This patient has chronic suicidal ideations related to his chronic substance abuse, at this time he expresses much interest in quitting his substance use but is seeking help for detox and rehabilitation.  Will psych clear at this time, and begin referral process for substance abuse detox and rehabilitation.     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 Pt denies depression, anxiety, irritability, hallucinations, and is linear, coherent, with improved affect this morning compared to day prior. Pt states he is amenable to receive OP substance use disorder treatment at a residential facility. Pt admits that his substance use has been controlling his life and states he will follow up after discharge and is ready to quit.   HPI: [name] is a 41 y.o. male with past psychiatric history significant for substance-induced mood  disorder, opioid (heroin, oxycodone, fentanyl) abuse/dependence and methamphetamine abuse, as well as multiple reported previous accidental overdoses, who presents to the [facility]) as a voluntary walk-in accompanied by his cousin Nydia Bouton [place]: [phone]) for worsening depression, SI, substance abuse (opioid, methamphetamine) issues, and anxiety.  With patient's consent, patient's cousin present during the evaluation and information was obtained from patient and patient's cousin during the evaluation.  Total Time spent with patient: 30 minutes  Past Psychiatric History:   Past Medical History:  Past Medical History:  Diagnosis Date   Back pain    Opiate abuse, continuous ([place])    Polysubstance (including opioids) dependence with physiological dependence ([place]) 04/20/2015   Substance induced mood disorder ([place]) 04/20/2015    Past Surgical History:  Procedure Laterality Date   FOOT SURGERY     TONSILLECTOMY     Family History: History reviewed. No pertinent family history. Family Psychiatric  History: Denies Social History:  Social History   Substance and Sexual Activity  Alcohol Use No   Comment: denies     Social History   Substance and Sexual Activity  Drug Use Yes   Types: Marijuana   Comment: cocaine x1 week ago, marijuana today- denies- heroin use on 02/23/16.    Social History   Socioeconomic History   Marital status: Divorced    Spouse name: Not on file   Number of children: Not on file   Years of education: Not on file   Highest education level: Not on file  Occupational History   Not on file  Tobacco Use   Smoking status: Every Day    Packs/day: 1.00    Types: Cigarettes   Smokeless  tobacco: Never  Substance and Sexual Activity   Alcohol use: No    Comment: denies   Drug use: Yes    Types: Marijuana    Comment: cocaine x1 week ago, marijuana today- denies- heroin use on 02/23/16.   Sexual activity: Not on file  Other Topics  Concern   Not on file  Social History Narrative   Not on file   Social Determinants of Health   Financial Resource Strain: Not on file  Food Insecurity: Not on file  Transportation Needs: Not on file  Physical Activity: Not on file  Stress: Not on file  Social Connections: Not on file    Tobacco Cessation:  N/A, patient does not currently use tobacco products  Current Medications: Current Facility-Administered Medications  Medication Dose Route Frequency Provider Last Rate Last Admin   acetaminophen (TYLENOL) tablet 650 mg  650 mg Oral Q4H PRN Ripley Fraise, MD       cloNIDine (CATAPRES) tablet 0.1 mg  0.1 mg Oral QID Margorie John W, PA-C       Followed by   Derrill Memo ON 03/30/2021] cloNIDine (CATAPRES) tablet 0.1 mg  0.1 mg Oral BH-qamhs Lovena Le, Cody W, PA-C       Followed by   Derrill Memo ON 04/01/2021] cloNIDine (CATAPRES) tablet 0.1 mg  0.1 mg Oral QAC breakfast Lovena Le, Cody W, PA-C       dicyclomine (BENTYL) tablet 20 mg  20 mg Oral Q6H PRN Lovena Le, Cody W, PA-C       doxycycline (VIBRA-TABS) tablet 100 mg  100 mg Oral BID Ripley Fraise, MD   100 mg at 03/28/21 0250   gabapentin (NEURONTIN) capsule 400-800 mg  400-800 mg Oral BID Ripley Fraise, MD   400 mg at 03/28/21 0250   hydrOXYzine (ATARAX) tablet 25 mg  25 mg Oral TID PRN Prescilla Sours, PA-C       loperamide (IMODIUM) capsule 2-4 mg  2-4 mg Oral PRN Margorie John W, PA-C       LORazepam (ATIVAN) tablet 1 mg  1 mg Oral Q6H PRN Margorie John W, PA-C       methocarbamol (ROBAXIN) tablet 500 mg  500 mg Oral Q8H PRN Margorie John W, PA-C       multivitamin with minerals tablet 1 tablet  1 tablet Oral Daily Lovena Le, Cody W, PA-C       naproxen (NAPROSYN) tablet 500 mg  500 mg Oral BID PRN Lovena Le, Cody W, PA-C       nicotine (NICODERM CQ - dosed in mg/24 hours) patch 14 mg  14 mg Transdermal Daily Ripley Fraise, MD       ondansetron (ZOFRAN-ODT) disintegrating tablet 4 mg  4 mg Oral Q6H PRN Prescilla Sours, PA-C       [START ON  03/29/2021] thiamine tablet 100 mg  100 mg Oral Daily Lovena Le, Cody W, PA-C       traZODone (DESYREL) tablet 50 mg  50 mg Oral QHS PRN Prescilla Sours, PA-C       Current Outpatient Medications  Medication Sig Dispense Refill   doxycycline (VIBRAMYCIN) 100 MG capsule Take 1 capsule (100 mg total) by mouth 2 (two) times daily. 20 capsule 0   gabapentin (NEURONTIN) 400 MG capsule Take 400-800 mg by mouth 2 (two) times daily.     ibuprofen (ADVIL,MOTRIN) 200 MG tablet Take 800 mg by mouth every 6 (six) hours as needed for mild pain or headache.     levothyroxine (SYNTHROID) 175 MCG tablet Take  1 tablet (175 mcg total) by mouth daily before breakfast. (Patient not taking: Reported on 03/28/2021) 30 tablet 2   naproxen (NAPROSYN) 500 MG tablet Take 1 tablet (500 mg total) by mouth 2 (two) times daily with a meal. (Patient not taking: Reported on 03/28/2021) 20 tablet 0   PTA Medications: (Not in a hospital admission)   Musculoskeletal: Strength & Muscle Tone: within normal limits Gait & Station: normal Patient leans: N/A  Psychiatric Specialty Exam:  Presentation  General Appearance: Appropriate for Environment; Casual  Eye Contact:Fair  Speech:Clear and Coherent; Normal Rate  Speech Volume:Normal  Handedness:Right   Mood and Affect  Mood:Depressed  Affect:Appropriate; Congruent; Depressed   Thought Process  Thought Processes:Coherent; Linear  Descriptions of Associations:Intact  Orientation:Full (Time, Place and Person)  Thought Content:Logical  History of Schizophrenia/Schizoaffective disorder:No  Duration of Psychotic Symptoms:Less than six months  Hallucinations:Hallucinations: None  Ideas of Reference:None  Suicidal Thoughts:Suicidal Thoughts: No  Homicidal Thoughts:Homicidal Thoughts: No   Sensorium  Memory:Immediate Good; Recent Fair; Remote Fair  Judgment:Fair  Insight:Fair   Executive Functions  Concentration:Fair  Attention  Span:Fair  [place]   Psychomotor Activity  Psychomotor Activity:Psychomotor Activity: Normal   Assets  Assets:Communication Skills; Desire for Improvement; Social Support; Resilience; Physical Health   Sleep  Sleep:Sleep: Fair    Physical Exam: Physical Exam ROS Blood pressure 129/78, pulse 85, temperature 98.4 F (36.9 C), temperature source Oral, resp. rate 16, SpO2 99 %. There is no height or weight on file to calculate BMI.   Demographic Factors:  Male, Divorced or widowed, Caucasian, Low socioeconomic status, Living alone, and Unemployed  Loss Factors: Decrease in vocational status and Financial problems/change in socioeconomic status  Historical Factors: Prior suicide attempts and Impulsivity  Risk Reduction Factors:   Sense of responsibility to family, Religious beliefs about death, Positive social support, Positive therapeutic relationship, and Positive coping skills or problem solving skills  Continued Clinical Symptoms:  Severe Anxiety and/or Agitation Alcohol/Substance Abuse/Dependencies More than one psychiatric diagnosis Unstable or Poor Therapeutic Relationship  Cognitive Features That Contribute To Risk:  None    Suicide Risk:  Minimal: No identifiable suicidal ideation.  Patients presenting with no risk factors but with morbid ruminations; may be classified as minimal risk based on the severity of the depressive symptoms   Plan Of Care/Follow-up recommendations:  Continue current medications. Please continue to take medications as directed. If your symptoms return, worsen, or persist please call your 911, report to local ER, or contact crisis hotline. Please do not drink alcohol or use any illegal substances while taking prescription medications.      Disposition: Psych cleared to discharge to substance abuse facility.  Suella Broad, FNP 03/28/2021, 11:09 AM

## 2021-03-28 NOTE — ED Notes (Signed)
Reviewed patient discharge papers and encouraged follow up with programs listed. Gave patient additional information on homeless shelters in the areas. Gave patient bus pass to get to places.

## 2021-03-28 NOTE — ED Triage Notes (Addendum)
Pt reports from [facility] with suicidal thoughts x 1 week because he feels like he isn't worthy. Pt does not have a current plan. Pt states that he is withdrawing from opiates. Pt has on a left wrist splint.

## 2021-03-28 NOTE — ED Notes (Signed)
PT reports he is desiring to rest. Ativan given and lights turned off.

## 2021-03-28 NOTE — ED Provider Notes (Signed)
[facility]-EMERGENCY DEPT Provider Note   CSN: [id number] Arrival date & time: 03/28/21  0047     History  Chief Complaint  Patient presents with   Suicidal    [name] is a 41 y.o. male.  HPI    Patient presents for evaluation for suicidal thoughts.  Patient reports he abuses multiple drugs and is having thoughts of harming himself.  He reports his course is worsening.  He was seen in behavioral health and sent here for evaluation. He reports he does feel he is starting to withdrawal.  Last use of heroin was approximately 24 hours ago.  He reports he started to have some diarrhea and nausea.  No vomiting.  No chest pain, no fevers.  Patient reports weakness in his left wrist that was evaluated recently.  He reports this occurred after he woke up after he overdosed Home Medications Prior to Admission medications   Medication Sig Start Date End Date Taking? Authorizing Provider  doxycycline (VIBRAMYCIN) 100 MG capsule Take 1 capsule (100 mg total) by mouth 2 (two) times daily. 03/25/21  Yes Theron Arista, PA-C  gabapentin (NEURONTIN) 400 MG capsule Take 400-800 mg by mouth 2 (two) times daily.   Yes [provider]  ibuprofen (ADVIL,MOTRIN) 200 MG tablet Take 800 mg by mouth every 6 (six) hours as needed for mild pain or headache.   Yes [provider]  levothyroxine (SYNTHROID) 175 MCG tablet Take 1 tablet (175 mcg total) by mouth daily before breakfast. Patient not taking: Reported on 03/28/2021 09/27/20   Anabel Halon, MD  naproxen (NAPROSYN) 500 MG tablet Take 1 tablet (500 mg total) by mouth 2 (two) times daily with a meal. Patient not taking: Reported on 03/28/2021 02/02/16   Pauline Aus, PA-C      Allergies    Penicillin g    Review of Systems   Review of Systems  Constitutional:  Negative for fever.  Cardiovascular:  Negative for chest pain.  Gastrointestinal:  Positive for diarrhea and nausea. Negative for vomiting.   Musculoskeletal:  Positive for arthralgias.  Neurological:  Positive for weakness.  Psychiatric/Behavioral:  Positive for suicidal ideas.    Physical Exam Updated Vital Signs BP (!) 143/89 (BP Location: Left Arm)    Pulse 76    Temp (!) 97.5 F (36.4 C) (Oral)    Resp 16    SpO2 98%  Physical Exam CONSTITUTIONAL: Disheveled, anxious HEAD: Normocephalic/atraumatic EYES: EOMI/PERRL ENMT: Mucous membranes moist NECK: supple no meningeal signs SPINE/BACK:entire spine nontender CV: S1/S2 noted, no murmurs/rubs/gallops noted LUNGS: Lungs are clear to auscultation bilaterally, no apparent distress ABDOMEN: soft, nontender NEURO: Pt is awake/alert/appropriate, moves all extremitiesx4.  No facial droop.   EXTREMITIES: pulses normal/equal, full ROM Splint noted to left upper extremity. He has  appropriate strength with left elbow flexion extension as well as range of motion of left shoulder.  Patient is unable to flex or extend his left wrist. Distal pulses are intact.  Compartment is soft.  No evidence of abscess or cellulitis. SKIN: warm, color normal, no abscess noted, no crepitus noted left forearm PSYCH: Anxious  ED Results / Procedures / Treatments   Labs (all labs ordered are listed, but only abnormal results are displayed) Labs Reviewed  COMPREHENSIVE METABOLIC PANEL - Abnormal; Notable for the following components:      Result Value   Potassium 3.4 (*)    Calcium 8.6 (*)    All other components within normal limits  SALICYLATE LEVEL -  Abnormal; Notable for the following components:   Salicylate Lvl <7.0 (*)    All other components within normal limits  ACETAMINOPHEN LEVEL - Abnormal; Notable for the following components:   Acetaminophen (Tylenol), Serum <10 (*)    All other components within normal limits  RESP PANEL BY RT-PCR (FLU A&B, COVID) ARPGX2  ETHANOL  CBC  RAPID URINE DRUG SCREEN, HOSP PERFORMED    EKG EKG Interpretation  Date/Time:  Tuesday March 28 2021 01:22:23 EST Ventricular Rate:  85 PR Interval:  117 QRS Duration: 103 QT Interval:  376 QTC Calculation: 448 R Axis:   89 Text Interpretation: Sinus rhythm Borderline short PR interval Confirmed by Zadie Rhine ([id number]) on 03/28/2021 1:38:54 AM  Radiology No results found.  Procedures Procedures    Medications Ordered in ED Medications  dicyclomine (BENTYL) tablet 20 mg (has no administration in time range)  loperamide (IMODIUM) capsule 2-4 mg (has no administration in time range)  methocarbamol (ROBAXIN) tablet 500 mg (has no administration in time range)  naproxen (NAPROSYN) tablet 500 mg (has no administration in time range)  ondansetron (ZOFRAN-ODT) disintegrating tablet 4 mg (has no administration in time range)  cloNIDine (CATAPRES) tablet 0.1 mg (has no administration in time range)    Followed by  cloNIDine (CATAPRES) tablet 0.1 mg (has no administration in time range)    Followed by  cloNIDine (CATAPRES) tablet 0.1 mg (has no administration in time range)  acetaminophen (TYLENOL) tablet 650 mg (has no administration in time range)  nicotine (NICODERM CQ - dosed in mg/24 hours) patch 14 mg (has no administration in time range)  LORazepam (ATIVAN) tablet 1 mg (has no administration in time range)  doxycycline (VIBRAMYCIN) capsule 100 mg (has no administration in time range)  gabapentin (NEURONTIN) capsule 400-800 mg (has no administration in time range)    ED Course/ Medical Decision Making/ A&P Clinical Course as of 03/28/21 0213  Tue Mar 28, 2021  0123 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  He reports he is having some improvement in range of motion but there is still weakness in the left wrist.  He has no other focal weakness.  He will continue to wear his splint. [DW]    Clinical Course User Index [DW] Zadie Rhine, MD                           Medical Decision Making Amount and/or Complexity of Data Reviewed Labs:  ordered.   This patient presents to the ED for concern of ideation, this involves an extensive number of treatment options, and is a complaint that carries with it a high risk of complications and morbidity.  Comorbidities that complicate the patient evaluation: Patients presentation is complicated by their history of Substance use disorder, mood disorder  Social Determinants of Health: Patients impaired access to primary care and opioid use disorder increases the complexity of managing their presentation  Additional history obtained: Additional history obtained from behavioral health physician assistant Records reviewed previous admission documents  Lab Tests: I Ordered, and personally interpreted labs.  The pertinent results include:  labs are overall unremarkable    Medicines ordered and prescription drug management: I ordered medication including detox protocol for opiate withdrawal   Consultations Obtained: I requested consultation with the consultant behavioral health, and discussed  findings as well as pertinent plan - they recommend: Admission  Complexity of problems addressed: Patients presentation is most consistent with  acute presentation with potential  threat to life or bodily function      Disposition: After consideration of the diagnostic results and the patients response to treatment,  I feel that the patent would benefit from admission.   The patient has been placed in psychiatric observation due to the need to provide a safe environment for the patient while obtaining psychiatric consultation and evaluation, as well as ongoing medical and medication management to treat the patient's condition.  The patient has not been placed under full IVC at this time.          Final Clinical Impression(s) / ED Diagnoses Final diagnoses:  Suicidal ideation  Substance abuse [facility])    Rx / DC Orders ED Discharge Orders     None         Zadie Rhine,  MD 03/28/21 [phone]

## 2021-03-28 NOTE — ED Provider Notes (Signed)
Emergency Medicine Observation Re-evaluation Note  [name] is a 41 y.o. male, seen on rounds today.  Pt initially presented to the ED for complaints of substance use disorder, and related mood disorder. Pt notes feeling improved today. No SI or HI. [place] team reassessing.   Physical Exam  BP 121/76 (BP Location: Left Arm)    Pulse 68    Temp 98.5 F (36.9 C) (Oral)    Resp 18    SpO2 99%  Physical Exam General: alert, calm, cooperative.  Cardiac: regular rate Lungs: breathing comfortably. Psych: calm, cooperative, conversant. Pt with normal mood and affect. Pt does not appear acutely depressed. No thoughts of harm to self or others. Pt is not responding to internal stimuli - no delusions or hallucinations noted.  Msk: wearing left wrist removable splint. Radial pulse palp. Normal cap refill in digits. No significant swelling noted to LUE, forearm or hand. No cellulitis/skin infection. No pain or tenderness. Pt notes was told nerve palsy - he  feels use/function slowly improving since symptom onset.   ED Course / MDM    I have reviewed the labs performed to date as well as medications administered while in observation.  Recent changes in the last 24 hours include ED obs, BH reassessment.   Plan    [name] is not under involuntary commitment.  [place] team indicates pt psych clear for d/c, and that they were not able to facilitate inpatient drug use treatment. They rec d/c and to provide resource guide. Discussed plan w pt. Pt agreeable.   Pts vitals normal, no pain or distress. No acute psychosis. Pt currently appears stable for d/c.        Lajean Saver, MD 03/28/21 [phone]

## 2021-03-28 NOTE — Discharge Instructions (Addendum)
It was our pleasure to provide your ER care today - we hope that you feel better. To help you maintain a sober lifestyle, a substance use disorder treatment program may be beneficial to you.  Contact one of the following providers at your earliest opportunity to ask about enrolling in their program:  MEDICAL DETOX:       [facility] (also provides residential rehab)      [address]      [place], [place] [address]      [phone]        [facility]       [address] [place]       [place], [place] [address]      [phone]        [facility]       [address] [place], [place] [address]       [phone]        [facility]       [address] [place], [place] [address]       [phone]       [facility] (also provides residential rehab)      [address]      [place], [place] [address]      [phone]  CHEMICAL DEPENDENCY INTENSIVE OUTPATIENT PROGRAMMING:       [facility] [place]      [address]      [place], [place] [address]      [phone]   For left arm nerve palsy, follow up closely with neurology this week as previously referred, also discuss physical therapy then - call today or tomorrow AM to arrange appointment. Your potassium level is slightly low - eat plenty of fruits and vegetables, and follow up with primary care doctor.   Return to ER if worse, new symptoms, fevers, new/severe pain, trouble breathing, or other concern.

## 2021-03-28 NOTE — H&P (Signed)
Behavioral Health Medical Screening Exam  Visit Diagnoses:   -Substance induced mood disorder (HCC)  -Opioid use disorder, severe, dependence (HCC)  -Methamphetamine use disorder, severe (HCC)   -Suicidal ideation  HPI:  [name] is a 41 y.o. male with past psychiatric history significant for substance-induced mood disorder, opioid (heroin, oxycodone, fentanyl) abuse/dependence and methamphetamine abuse, as well as multiple reported previous accidental overdoses, who presents to the [facility]) as a voluntary walk-in accompanied by his cousin Red Christians [place]: [phone]) for worsening depression, SI, substance abuse (opioid, methamphetamine) issues, and anxiety.  With patient's consent, patient's cousin present during the evaluation and information was obtained from patient and patient's cousin during the evaluation.  Patient reports that he had his cousin bring him to [facility] because he is hoping to receive assistance for his substance abuse issues, depression, and suicidal thoughts.  Patient endorses SI without plan currently on exam, but does not specify if his current SI is passive or active at this time.  Patient does report that he has had recurrent SI over the past week. He states that in addition to this SI that he has experienced over this past week, he has thought about multiple ways to attempt suicide over this past week, which include thoughts of attempting suicide by shooting himself, overdose, and strangulation.  Patient denies attempting to harm himself via these methods noted above in the past.  Patient denies history of any past suicide attempts.  Patient does acknowledge that he has almost died secondary to accidental overdose multiple times in the past, in which patient and patient's cousin states that the patient accidentally overdosed on opioids on 2 occasions within the past week: Once on last Monday, 03/20/2021 and once on last Wednesday, 03/22/2021.   Patient states that he only received medical care for the overdose that that took place on Monday, 03/20/2021 and did not receive any care for the overdose that reportedly occurred on 03/22/2021.  Patient states that when he accidentally overdosed on 03/20/2021, he thought that he was injecting methamphetamine at that time, but states that he ultimately later found out that this methamphetamine also contained fentanyl.  Per chart review, patient presented to [facility] emergency department on 03/21/2021 for accidental opioid overdose.  Per chart review of this 03/21/2021 ED visit, there are multiple dictations inpatient ED visit documentation of the progress of patient's physical symptoms/treatment/care, which include documented updates on patient's care from 12:06 PM, 2:46 PM, 3:47 PM, and 4:19 PM, in which during the 4:19 PM documentation, it was documented that the patient left AMA, but was "of sound mind" and able to communicate understanding of risks at that time.  Patient states that he left the ED at that time because he had been in the ED all day that day, was not having any physical symptoms at that time, and did not think that he needed to stay in the ED any longer.  Patient denies any additional overdoses since his most recent reported overdose of 03/22/2021.    Patient reports drinking an unknown amount of alcohol a few times per week.  He reports that his last alcohol consumption was about 1 week ago.  He denies history of any alcohol related withdrawal symptoms.  He endorses history of 1 seizure that occurred in his 55s which patient states was secondary to Xanax withdrawal.  Patient denies history of any additional seizures in the past and he denies any benzodiazepine use currently.  Patient endorses using heroin and  fentanyl via IV route and also using opioid pills in the form of fentanyl and oxycodone (patient states that the dosages of the oxycodone pills he uses are "20s, 30s, 40s").  He reports  that he last used opioid pills 1 week ago.  Patient endorses using heroin/fentanyl via IV route daily.  He reports that his last heroin/fentanyl use was "1 point" last night on 03/26/2021.  Patient unable to provide details regarding the typical frequency of p.o. or IV opiate use that he typically uses per episode of use.  Patient endorses history of the following opioid withdrawal symptoms: Chills, diaphoresis, myalgias, body aches, abdominal pain, nausea, vomiting, anxiety.  Patient endorses currently experiencing body aches, anxiety and "hot flashes", but patient denies any of the additional opioid withdrawal symptoms noted above or currently on exam.  Patient denies history of tremor.  Patient also reports that he is unsure if he has had any fevers recently.  Patient afebrile on exam with temperature of 98.5 F.  Patient also endorses having symptoms of intermittent photosensitivity for the past 3 days.  Patient also endorses using "one shot" of methamphetamine via IV route daily and he reports that his last use was an unknown amount 3 days ago.  Patient does endorse having a "floppy wrist" of his left wrist as well as numbness and decreased sensation of his left wrist, hand, and digits of the left hand.  Patient also endorses numbness of his left arm, but denies any pain, tingling, or decreased strength of his left arm or decreased ability to move his left arm. Patient also endorses decreased strength of his left wrist/hand/digits of the left hand and states that he is unable to move his left wrist, left hand, or digits of the left hand at this time.  Patient states that these left hand/wrist/digits symptoms began last week after patient woke up from his most recent reported overdose of 03/22/2021.  Patient states that he thinks that he may have damaged a nerve in his left arm when he was injecting IV drugs on 03/22/2021, which he believes may be contributing to his current symptoms.  Patient states that he went to  [facility] emergency department for these complaints and was diagnosed with a "floppy wrist", was placed in a left hand/wrist brace, and was initiated on doxycycline for prophylactic treatment of any potential infection that patient may have secondary to his IV drug use.  Per chart review, patient did present to [facility] emergency department on 03/25/2021 for these left upper extremity symptoms noted above.  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  Per chart review of this to 03/25/21 ED visit, it does not appear that patient had any evidence of infection at that time, but it appears that the patient was initiated on a 10-day course of doxycycline 100 mg p.o. twice daily for empiric/prophylactic treatment of any potential infection. Chart review of this ED visit shows that patient also received X-ray of left forearm which did not show any acute findings/fracture or evidence of foreign bodies, patient was ultimately determined to not need medical admission at that time, and patient was medically cleared and discharged from the ED. Furthermore, patient denies any headache, lightheadedness, dizziness, vision changes, chest pain, shortness of breath, additional LOC, or any additional physical symptoms currently on exam or since his most recent  reported overdose of 03/22/2021.  Patient denies any additional substance use at this time.  Patient endorses increased anxiety but does not provide further details regarding this.  Patient denies history of self-injurious behavior via intentionally cutting or burning himself.  Patient denies HI.  He denies AVH.  He does endorse feelings of paranoia that some people that he has been "hanging around" with recently are out to get him or trying to harm him, but he does not provide further details regarding this.  Patient's cousin denies the  patient making any active suicidal statements to him recently, but he does report that the patient has made passive suicidal statements to him recently that consist of the patient making statements that he would "be better off not here".  Patient describes his sleep as poor, but does not provide details regarding number of hours of sleep per night.  He endorses anhedonia and feelings of guilt, hopelessness, and worthlessness.  He endorses declines in energy and concentration over the past few months.  He denies appetite changes but does state that he has lost about 60 pounds since October 2022.  Patient states that his substance use and being unemployed due to losing his job in October 2022 where his main stressors that are contributing to his depressive symptoms.  Patient denies taking any psychotropic medications at this time.  Patient denies taking any additional home medications at this time aside from the doxycycline noted above, in which patient states that he is unsure about how much of his doxycycline prescription he has completed at this time.  He denies having an outpatient psychiatrist or therapist.  Patient does state that he took gabapentin and Prozac in the past when he was incarcerated in 2022.  Patient also reports that he previously took Adderall for ADHD.  Per PDMP review, no controlled substance prescriptions noted.  Per chart review, patient was psychiatrically hospitalized at [facility] from 04/19/2015 to 04/25/2015.  Patient denies history of any additional inpatient psychiatric hospitalizations.  Patient reports that he is currently homeless in the [place] area.  Patient denies access to firearms.  On exam, patient is sitting upright with disheveled appearance, no acute distress.  Eye contact is fair and fleeting.  Speech is clear and coherent with normal rate but speech is slightly slurred at times.  Speech volume normal.  Mood is depressed with mood congruent affect.  Thought process is  coherent, goal directed, and linear.  Patient is alert and oriented x4, cooperative, answers all questions appropriately during the evaluation.  No indication the patient is responding to internal/external stimuli.  No delusional thought content noted at this time.  Total Time spent with patient: 30 minutes  Psychiatric Specialty Exam:  Presentation  General Appearance: Appropriate for Environment; Disheveled  Eye Contact:Fleeting; Fair  Speech:Clear and Coherent; Normal Rate (Slightly slurred)  Speech Volume:Normal  Handedness:No data recorded  Mood and Affect  Mood:Depressed  Affect:Congruent   Thought Process  Thought Processes:Coherent; Goal Directed; Linear  Descriptions of Associations:Intact  Orientation:Full (Time, Place and Person)  Thought Content:Logical; Paranoid Ideation  History of Schizophrenia/Schizoaffective disorder:No  Duration of Psychotic Symptoms:Less than six months  Hallucinations:Hallucinations: None  Ideas of Reference:Paranoia  Suicidal Thoughts:Suicidal Thoughts: -- (Patient endorses SI without plan currently, but does not specify if his SI is passive or active at this time.)  Homicidal Thoughts:Homicidal Thoughts: No   Sensorium  Memory:Immediate Fair; Recent Fair; Remote Fair  Judgment:Poor  Insight:Poor; Lacking   Executive Functions  Concentration:Fair  Attention Span:Fair  Recall:Fair  Fund of Knowledge:Fair  Language:Fair   Psychomotor Activity  Psychomotor Activity:Psychomotor Activity: Normal   Assets  Assets:Communication Skills; Desire for Improvement; Leisure Time; Physical Health; Resilience; Social Support   Sleep  Sleep:Sleep: Poor    Physical Exam: Physical Exam Vitals reviewed.  Constitutional:      General: He is not in acute distress.    Appearance: He is not ill-appearing, toxic-appearing or diaphoretic.  HENT:     Head: Normocephalic and atraumatic.     Right Ear: External ear normal.      Left Ear: External ear normal.     Nose: Nose normal.  Eyes:     General:        Right eye: No discharge.        Left eye: No discharge.     Conjunctiva/sclera: Conjunctivae normal.  Cardiovascular:     Rate and Rhythm: Normal rate.     Comments: Left radial pulse intact and 2+.  Capillary refill less than 2 seconds/within normal limits of left hand. Pulmonary:     Effort: Pulmonary effort is normal. No respiratory distress.  Musculoskeletal:        General: Normal range of motion.     Cervical back: Normal range of motion.     Comments: Patient has full, nonpainful, active range of motion and 5 out of 5 strength of flexion/extension of left arm/forearm.  Patient unable to perform flexion/extension of the left wrist at this time.  Patient unable to perform abduction/abduction or flexion/extension of left hand digits at this time.  Patient has lace up splint/brace on his left hand/wrist/lower forearm.  Skin:    Comments: Multiple superficial lesions noted on dorsal surfaces patient's bilateral hands with no signs of active bleeding, erythema, drainage, discharge, or infection noted, in which these lesions appear to be consistent with track marks.  Skin of patient's left hand, left wrist and digits of the left hand not abnormally warm to palpation.  No discoloration noted of patient's bilateral hands or digits of bilateral hands.  Neurological:     General: No focal deficit present.     Mental Status: He is alert and oriented to person, place, and time.     Comments: No tremor noted.  Decreased sensation to light touch noted of palmar surface of the left hand, dorsal surface of the left hand, and all digits of patient's left hand.  No sensory abnormalities noted of patient's left arm.  Psychiatric:        Attention and Perception: Attention and perception normal. He does not perceive auditory or visual hallucinations.        Mood and Affect: Mood is depressed.        Behavior: Behavior is  not agitated, slowed, aggressive, withdrawn, hyperactive or combative. Behavior is cooperative.        Thought Content: Thought content is paranoid. Thought content is not delusional. Thought content includes suicidal ideation. Thought content does not include homicidal ideation. Thought content does not include suicidal plan.     Comments: Affect mood-congruent.    Review of Systems  Constitutional:  Positive for chills, diaphoresis, malaise/fatigue and weight loss.  HENT:  Negative for congestion.   Eyes:  Positive for photophobia.  Respiratory:  Negative for cough and shortness of breath.   Cardiovascular:  Negative for chest pain and palpitations.  Gastrointestinal:  Positive for nausea. Negative for abdominal pain, constipation, diarrhea and vomiting.  Musculoskeletal:  Positive for back pain and myalgias.  Neurological:  Positive for seizures.  Negative for dizziness, loss of consciousness and headaches.       See HPI/details below for further neuro ROS.   Psychiatric/Behavioral:  Positive for depression, substance abuse and suicidal ideas. Negative for hallucinations and memory loss. The patient is nervous/anxious and has insomnia.   All other systems reviewed and are negative. Patient endorses history of the following opioid withdrawal symptoms: Chills, diaphoresis, myalgias, body aches, abdominal pain, nausea, vomiting, anxiety.  Patient endorses currently experiencing body aches, anxiety and "hot flashes", but patient denies any of the additional opioid withdrawal symptoms noted above or currently on exam.  Patient denies history of tremor.  Patient also reports that he is unsure if he has had any fevers recently.  Patient afebrile on exam with temperature of 98.5 F.  Patient also endorses having symptoms of intermittent photosensitivity for the past 3 days.  Patient also endorses using "one shot" of methamphetamine via IV route daily and he reports that his last use was an unknown amount 3  days ago.  Patient does endorse having a "floppy wrist" of his left wrist as well as numbness and decreased sensation of his left wrist, hand, and digits of the left hand.  Patient also endorses numbness of his left arm, but denies any pain, tingling, or decreased strength of his left arm or decreased ability to move his left arm. Patient also endorses decreased strength of his left wrist/hand/digits of the left hand and states that he is unable to move his left wrist, left hand, or digits of the left hand at this time.  Patient states that these left hand/wrist/digits symptoms began last week after patient woke up from his most recent reported overdose of 03/22/2021.  Patient states that he thinks that he may have damaged a nerve in his left arm when he was injecting IV drugs on 03/22/2021, which he believes may be contributing to his current symptoms.   Furthermore, patient denies any headache, lightheadedness, dizziness, vision changes, chest pain, shortness of breath, additional LOC, or any additional physical symptoms currently on exam or since his most recent reported overdose of 03/22/2021.  Vitals: Blood pressure 122/75, pulse 85, temperature 98.5 F (36.9 C), temperature source Oral, resp. rate 18, SpO2 100 %. There is no height or weight on file to calculate BMI.  Musculoskeletal: Strength & Muscle Tone: within normal limits Gait & Station: normal Patient leans: N/A   Recommendations:  Based on my evaluation the patient does not appear to have an emergency medical condition.  Based on my evaluation of the patient and patient's presentation, which includes SI, depression, and substance abuse (the opioid, methamphetamine) issues, the patient's substance use and depressive symptoms appear to be severely negatively impacting the patient's ability to function in his activities of daily living at this time and the patient also appears to be a danger to himself at this time.  Thus, based on these  factors, the patient meets criteria for inpatient dual diagnosis psychiatric/substance abuse treatment at this time.  Recommend inpatient dual diagnosis psychiatric treatment for the patient at this time.  [facility].  Will transfer the patient to [facility] ED for medical clearance for inpatient psychiatric treatment and for further safety monitoring/observation and social work will seek placement for inpatient psychiatric treatment for the patient while patient is in the ED.  Patient is agreeable to the overall plan of care noted above.  Provider report given to  Dr. Bebe Shaggy, who has agreed to accept the patient.  Provider report also given to [facility] ED Charge RN (TJ) (per [facility] ED Charge RN request).   Discussed with the patient initiating hydroxyzine as needed for anxiety and trazodone at bedtime as needed for sleep, as well as Clonidine taper for patient's opiate withdrawal symptoms.  Patient educated on side effect profiles of hydroxyzine, trazodone, and clonidine.  After discussion, patient agrees to initiate hydroxyzine as needed for anxiety, trazodone at bedtime as needed for sleep, and clonidine taper for opiate withdrawal symptoms.  Will place orders for patient to begin the following psychotropic medications in the ED:  -Hydroxyzine 25 mg p.o. 3 times daily as needed for anxiety  -Trazodone 50 mg p.o. at bedtime as needed for sleep  For opioid withdrawal symptoms, will initiate COWS protocol while patient is in the ED with the following medication orders for patient to have in the ED for his opioid withdrawal symptoms as well:  -Clonidine taper:   -Clonidine 0.1 mg p.o. 4 times daily (first dose scheduled to be given on 03/28/2021 at 1000) for 8 doses followed by   -Clonidine 0.1 mg p.o. BH-q am and hs (first dose to be given on 03/30/2021 at 1000) for 4 doses followed by   -Clonidine 0.1 mg p.o. daily before  breakfast (first dose scheduled to be given on 04/01/2021 at 0800) for 2 doses  -Bentyl 20 mg p.o. every 6 hours as needed for spasms, abdominal cramping  -Imodium capsule 2 to 4 mg p.o. as needed for diarrhea or loose stools  -Robaxin 500 mg p.o. every 8 hours as needed for muscle spasms  -Naproxen 500 mg p.o. twice daily as needed for aching, pain, or discomfort  -Zofran ODT 4 mg p.o. every 6 hours as needed for nausea/vomiting  Although patient denies significant alcohol use or history of alcohol-related withdrawal symptoms or alcohol-related withdrawal seizures, will initiate CIWA protocol while patient is in the ED with the following medication orders for patient to have in the ED for development of any potential alcohol withdrawal symptoms (majority of as needed medications ordered above for opioid withdrawal symptoms may be used for alcohol withdrawal symptoms as well):  -Ativan 1 mg p.o. every 6 hours as needed for CIWA score greater than 10  -Multivitamin with minerals tablet: 1 tablet p.o. daily for nutritional supplementation  -Thiamine tablet 100 mg p.o. daily for nutritional supplementation  Jaclyn Shaggy, PA-C 03/28/2021, 12:06 AM

## 2021-03-28 NOTE — BH Assessment (Signed)
[facility] Assessment Progress Note   Per Caryn Bee, NP, this voluntary pt does not require psychiatric hospitalization at this time, but would benefit from admission to a facility providing residential detoxification.  This Clinical research associate reached out to several such facilities, but pt was declined by [facility], and a timely response was not received by the others.  Pt is psychiatrically cleared.  Discharge instructions include referral information for substance use disorder treatment providers.  EDP Cathren Laine, MD and pt's nurse, [facility], have been notified.  Doylene Canning, MA Triage Specialist [phone]

## 2021-09-16 ENCOUNTER — Emergency Department (HOSPITAL_COMMUNITY)
Admission: EM | Admit: 2021-09-16 | Discharge: 2021-09-17 | Payer: Self-pay | Attending: Emergency Medicine | Admitting: Emergency Medicine

## 2021-09-16 ENCOUNTER — Encounter (HOSPITAL_COMMUNITY): Payer: Self-pay

## 2021-09-16 ENCOUNTER — Emergency Department (HOSPITAL_COMMUNITY): Payer: Self-pay

## 2021-09-16 DIAGNOSIS — S0990XA Unspecified injury of head, initial encounter: Secondary | ICD-10-CM | POA: Insufficient documentation

## 2021-09-16 DIAGNOSIS — E039 Hypothyroidism, unspecified: Secondary | ICD-10-CM | POA: Insufficient documentation

## 2021-09-16 DIAGNOSIS — S0083XA Contusion of other part of head, initial encounter: Secondary | ICD-10-CM | POA: Insufficient documentation

## 2021-09-16 DIAGNOSIS — Z79899 Other long term (current) drug therapy: Secondary | ICD-10-CM | POA: Insufficient documentation

## 2021-09-16 DIAGNOSIS — S63502A Unspecified sprain of left wrist, initial encounter: Secondary | ICD-10-CM | POA: Insufficient documentation

## 2021-09-16 NOTE — ED Triage Notes (Signed)
[facility], c/o of L wrist pain, denies LOC, pt is non cooperative
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
## 2021-09-17 NOTE — Discharge Instructions (Signed)
Wear wrist splint for comfort and support.  Ice for 20 minutes every 2 hours while awake for the next 2 days.  Take ibuprofen 600 mg every 6 hours as needed for pain.  Follow-up with primary doctor if not improving in the next week.

## 2022-05-26 DIAGNOSIS — F1099 Alcohol use, unspecified with unspecified alcohol-induced disorder: Secondary | ICD-10-CM | POA: Diagnosis not present

## 2022-05-27 DIAGNOSIS — F12288 Cannabis dependence with other cannabis-induced disorder: Secondary | ICD-10-CM | POA: Diagnosis not present

## 2022-05-27 DIAGNOSIS — F1024 Alcohol dependence with alcohol-induced mood disorder: Secondary | ICD-10-CM | POA: Diagnosis not present

## 2022-05-27 DIAGNOSIS — Z638 Other specified problems related to primary support group: Secondary | ICD-10-CM | POA: Diagnosis not present

## 2022-05-27 DIAGNOSIS — F10239 Alcohol dependence with withdrawal, unspecified: Secondary | ICD-10-CM | POA: Diagnosis not present

## 2022-05-27 DIAGNOSIS — Z79899 Other long term (current) drug therapy: Secondary | ICD-10-CM | POA: Diagnosis not present

## 2022-05-27 DIAGNOSIS — E039 Hypothyroidism, unspecified: Secondary | ICD-10-CM | POA: Diagnosis not present

## 2022-05-27 DIAGNOSIS — F1124 Opioid dependence with opioid-induced mood disorder: Secondary | ICD-10-CM | POA: Diagnosis not present

## 2022-05-27 DIAGNOSIS — Z5181 Encounter for therapeutic drug level monitoring: Secondary | ICD-10-CM | POA: Diagnosis not present

## 2022-05-27 DIAGNOSIS — F1220 Cannabis dependence, uncomplicated: Secondary | ICD-10-CM | POA: Diagnosis not present

## 2022-05-27 DIAGNOSIS — Z1152 Encounter for screening for COVID-19: Secondary | ICD-10-CM | POA: Diagnosis not present

## 2022-05-27 DIAGNOSIS — Z87891 Personal history of nicotine dependence: Secondary | ICD-10-CM | POA: Diagnosis not present

## 2022-05-27 DIAGNOSIS — F1123 Opioid dependence with withdrawal: Secondary | ICD-10-CM | POA: Diagnosis not present

## 2022-05-27 DIAGNOSIS — F1020 Alcohol dependence, uncomplicated: Secondary | ICD-10-CM | POA: Diagnosis not present

## 2022-05-27 DIAGNOSIS — F1520 Other stimulant dependence, uncomplicated: Secondary | ICD-10-CM | POA: Diagnosis not present

## 2022-05-27 DIAGNOSIS — F1524 Other stimulant dependence with stimulant-induced mood disorder: Secondary | ICD-10-CM | POA: Diagnosis not present

## 2022-05-27 DIAGNOSIS — Z597 Insufficient social insurance and welfare support: Secondary | ICD-10-CM | POA: Diagnosis not present

## 2022-05-27 DIAGNOSIS — F1120 Opioid dependence, uncomplicated: Secondary | ICD-10-CM | POA: Diagnosis not present

## 2022-05-27 DIAGNOSIS — Z5986 Financial insecurity: Secondary | ICD-10-CM | POA: Diagnosis not present

## 2022-05-27 DIAGNOSIS — F1099 Alcohol use, unspecified with unspecified alcohol-induced disorder: Secondary | ICD-10-CM | POA: Diagnosis not present

## 2022-05-27 DIAGNOSIS — F1994 Other psychoactive substance use, unspecified with psychoactive substance-induced mood disorder: Secondary | ICD-10-CM | POA: Diagnosis not present

## 2022-05-27 DIAGNOSIS — Z79890 Hormone replacement therapy (postmenopausal): Secondary | ICD-10-CM | POA: Diagnosis not present

## 2022-05-27 DIAGNOSIS — F17210 Nicotine dependence, cigarettes, uncomplicated: Secondary | ICD-10-CM | POA: Diagnosis not present

## 2022-05-27 DIAGNOSIS — Z720 Tobacco use: Secondary | ICD-10-CM | POA: Diagnosis not present

## 2022-05-29 DIAGNOSIS — E039 Hypothyroidism, unspecified: Secondary | ICD-10-CM | POA: Diagnosis not present

## 2022-05-29 DIAGNOSIS — F1120 Opioid dependence, uncomplicated: Secondary | ICD-10-CM | POA: Diagnosis not present

## 2022-05-29 DIAGNOSIS — F1994 Other psychoactive substance use, unspecified with psychoactive substance-induced mood disorder: Secondary | ICD-10-CM | POA: Diagnosis not present

## 2022-05-29 DIAGNOSIS — R7989 Other specified abnormal findings of blood chemistry: Secondary | ICD-10-CM | POA: Diagnosis not present

## 2022-05-29 DIAGNOSIS — Z720 Tobacco use: Secondary | ICD-10-CM | POA: Diagnosis not present

## 2022-05-29 DIAGNOSIS — F1020 Alcohol dependence, uncomplicated: Secondary | ICD-10-CM | POA: Diagnosis not present

## 2022-05-29 DIAGNOSIS — F1220 Cannabis dependence, uncomplicated: Secondary | ICD-10-CM | POA: Diagnosis not present

## 2022-05-29 DIAGNOSIS — Z5181 Encounter for therapeutic drug level monitoring: Secondary | ICD-10-CM | POA: Diagnosis not present

## 2022-05-29 DIAGNOSIS — F1520 Other stimulant dependence, uncomplicated: Secondary | ICD-10-CM | POA: Diagnosis not present

## 2022-12-22 IMAGING — DX DG FOREARM 2V*L*
2 series · 2 of 2 positions shown · non-contrast
Comparison: None.

CLINICAL DATA: LEFT arm pain. History of meth and heroin
injections.

EXAM:
LEFT FOREARM - 2 VIEW

[forearm ap]
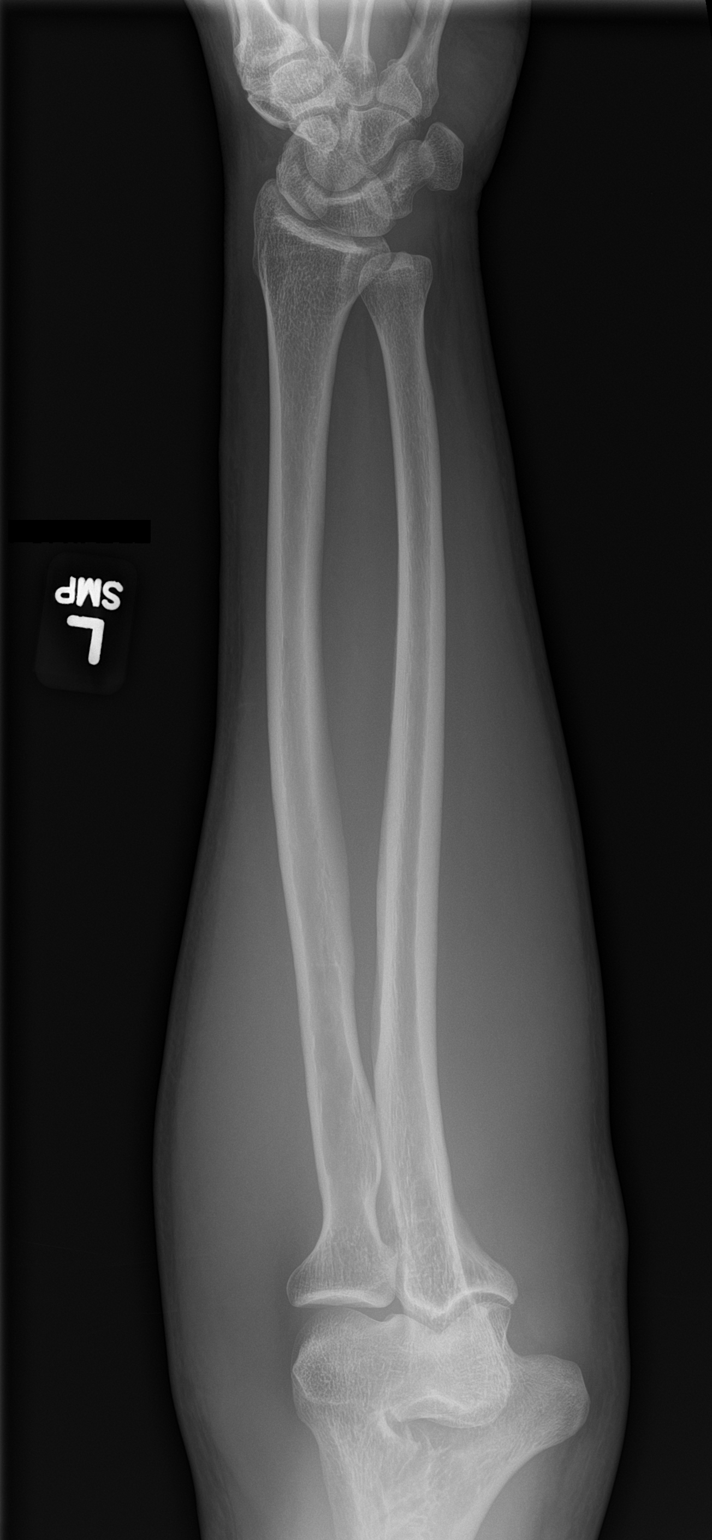

[forearm lat]
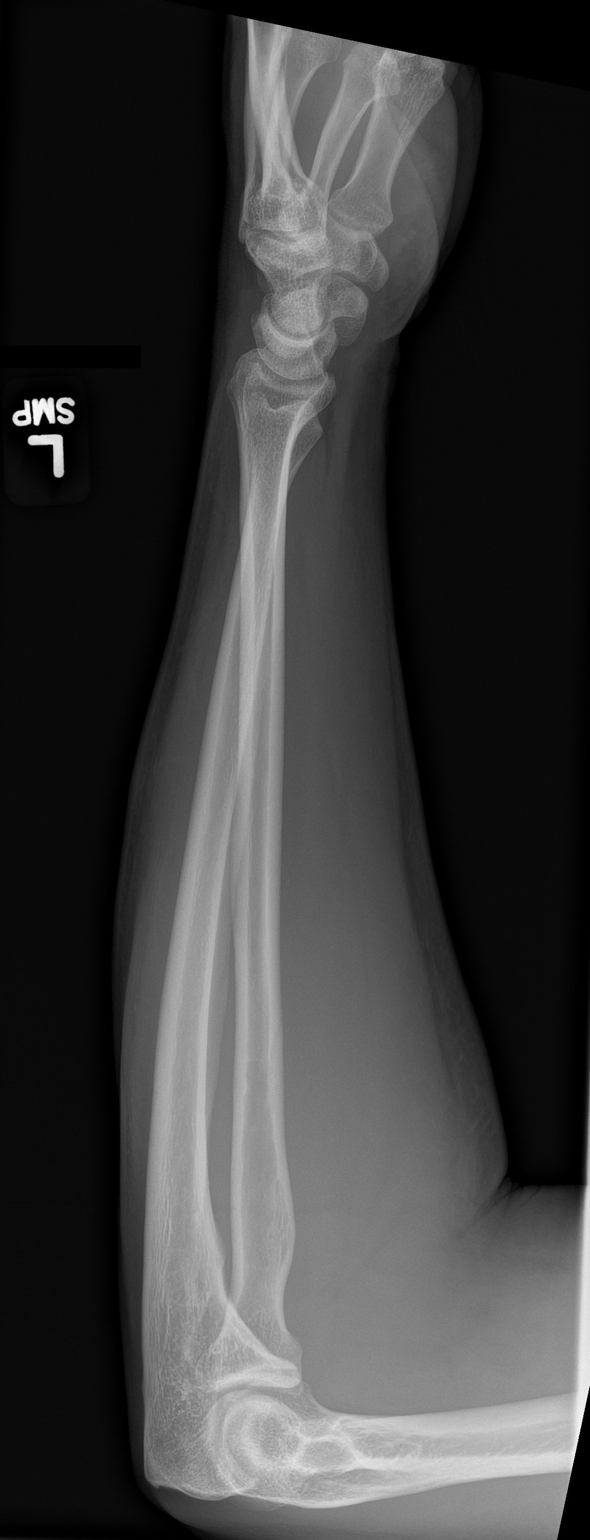

[2 of 2 positions shown; findings below may reference images not displayed]

FINDINGS: Osseous alignment is normal. No focal cortical irregularity or
osseous lesion. No evidence of osteomyelitis.

Soft tissues of the forearm are unremarkable. No soft tissue gas
seen.
IMPRESSION: Negative.

## 2023-03-18 DIAGNOSIS — L03211 Cellulitis of face: Secondary | ICD-10-CM | POA: Diagnosis not present

## 2023-03-18 DIAGNOSIS — Z8614 Personal history of Methicillin resistant Staphylococcus aureus infection: Secondary | ICD-10-CM | POA: Diagnosis not present

## 2023-03-18 DIAGNOSIS — K130 Diseases of lips: Secondary | ICD-10-CM | POA: Diagnosis not present

## 2023-03-18 DIAGNOSIS — R21 Rash and other nonspecific skin eruption: Secondary | ICD-10-CM | POA: Diagnosis not present

## 2023-09-25 DIAGNOSIS — F1910 Other psychoactive substance abuse, uncomplicated: Secondary | ICD-10-CM | POA: Diagnosis not present

## 2023-09-25 DIAGNOSIS — F1010 Alcohol abuse, uncomplicated: Secondary | ICD-10-CM | POA: Diagnosis not present

## 2023-09-25 DIAGNOSIS — F1020 Alcohol dependence, uncomplicated: Secondary | ICD-10-CM | POA: Diagnosis not present

## 2023-09-26 DIAGNOSIS — F1020 Alcohol dependence, uncomplicated: Secondary | ICD-10-CM | POA: Diagnosis not present

## 2023-09-26 DIAGNOSIS — Z136 Encounter for screening for cardiovascular disorders: Secondary | ICD-10-CM | POA: Diagnosis not present

## 2023-09-27 DIAGNOSIS — R001 Bradycardia, unspecified: Secondary | ICD-10-CM | POA: Diagnosis not present

## 2023-09-27 DIAGNOSIS — F1020 Alcohol dependence, uncomplicated: Secondary | ICD-10-CM | POA: Diagnosis not present

## 2023-09-30 DIAGNOSIS — F332 Major depressive disorder, recurrent severe without psychotic features: Secondary | ICD-10-CM | POA: Diagnosis not present

## 2023-09-30 DIAGNOSIS — F1520 Other stimulant dependence, uncomplicated: Secondary | ICD-10-CM | POA: Diagnosis not present

## 2023-09-30 DIAGNOSIS — F1120 Opioid dependence, uncomplicated: Secondary | ICD-10-CM | POA: Diagnosis not present

## 2023-09-30 DIAGNOSIS — F1020 Alcohol dependence, uncomplicated: Secondary | ICD-10-CM | POA: Diagnosis not present

## 2023-09-30 DIAGNOSIS — E039 Hypothyroidism, unspecified: Secondary | ICD-10-CM | POA: Diagnosis not present

## 2023-12-21 DIAGNOSIS — F4310 Post-traumatic stress disorder, unspecified: Secondary | ICD-10-CM | POA: Diagnosis not present

## 2023-12-21 DIAGNOSIS — F1220 Cannabis dependence, uncomplicated: Secondary | ICD-10-CM | POA: Diagnosis not present

## 2023-12-21 DIAGNOSIS — F900 Attention-deficit hyperactivity disorder, predominantly inattentive type: Secondary | ICD-10-CM | POA: Diagnosis not present

## 2023-12-21 DIAGNOSIS — F10230 Alcohol dependence with withdrawal, uncomplicated: Secondary | ICD-10-CM | POA: Diagnosis not present

## 2023-12-21 DIAGNOSIS — F1120 Opioid dependence, uncomplicated: Secondary | ICD-10-CM | POA: Diagnosis not present

## 2023-12-21 DIAGNOSIS — F1520 Other stimulant dependence, uncomplicated: Secondary | ICD-10-CM | POA: Diagnosis not present

## 2023-12-21 DIAGNOSIS — F17200 Nicotine dependence, unspecified, uncomplicated: Secondary | ICD-10-CM | POA: Diagnosis not present

## 2023-12-21 DIAGNOSIS — F1020 Alcohol dependence, uncomplicated: Secondary | ICD-10-CM | POA: Diagnosis not present

## 2023-12-21 DIAGNOSIS — F1420 Cocaine dependence, uncomplicated: Secondary | ICD-10-CM | POA: Diagnosis not present

## 2023-12-27 DIAGNOSIS — F900 Attention-deficit hyperactivity disorder, predominantly inattentive type: Secondary | ICD-10-CM | POA: Diagnosis not present

## 2023-12-27 DIAGNOSIS — F1020 Alcohol dependence, uncomplicated: Secondary | ICD-10-CM | POA: Diagnosis not present

## 2023-12-27 DIAGNOSIS — F17200 Nicotine dependence, unspecified, uncomplicated: Secondary | ICD-10-CM | POA: Diagnosis not present

## 2023-12-27 DIAGNOSIS — F1420 Cocaine dependence, uncomplicated: Secondary | ICD-10-CM | POA: Diagnosis not present

## 2023-12-27 DIAGNOSIS — F10230 Alcohol dependence with withdrawal, uncomplicated: Secondary | ICD-10-CM | POA: Diagnosis not present

## 2023-12-27 DIAGNOSIS — F1120 Opioid dependence, uncomplicated: Secondary | ICD-10-CM | POA: Diagnosis not present

## 2023-12-27 DIAGNOSIS — F1520 Other stimulant dependence, uncomplicated: Secondary | ICD-10-CM | POA: Diagnosis not present

## 2023-12-27 DIAGNOSIS — F1220 Cannabis dependence, uncomplicated: Secondary | ICD-10-CM | POA: Diagnosis not present

## 2023-12-27 DIAGNOSIS — F4310 Post-traumatic stress disorder, unspecified: Secondary | ICD-10-CM | POA: Diagnosis not present

## 2023-12-28 DIAGNOSIS — F10230 Alcohol dependence with withdrawal, uncomplicated: Secondary | ICD-10-CM | POA: Diagnosis not present

## 2023-12-28 DIAGNOSIS — F900 Attention-deficit hyperactivity disorder, predominantly inattentive type: Secondary | ICD-10-CM | POA: Diagnosis not present

## 2023-12-28 DIAGNOSIS — F1520 Other stimulant dependence, uncomplicated: Secondary | ICD-10-CM | POA: Diagnosis not present

## 2023-12-28 DIAGNOSIS — F4310 Post-traumatic stress disorder, unspecified: Secondary | ICD-10-CM | POA: Diagnosis not present

## 2023-12-28 DIAGNOSIS — F1420 Cocaine dependence, uncomplicated: Secondary | ICD-10-CM | POA: Diagnosis not present

## 2023-12-28 DIAGNOSIS — F1120 Opioid dependence, uncomplicated: Secondary | ICD-10-CM | POA: Diagnosis not present

## 2023-12-28 DIAGNOSIS — F17200 Nicotine dependence, unspecified, uncomplicated: Secondary | ICD-10-CM | POA: Diagnosis not present

## 2023-12-28 DIAGNOSIS — F1220 Cannabis dependence, uncomplicated: Secondary | ICD-10-CM | POA: Diagnosis not present

## 2023-12-28 DIAGNOSIS — F1020 Alcohol dependence, uncomplicated: Secondary | ICD-10-CM | POA: Diagnosis not present

## 2023-12-29 DIAGNOSIS — F10230 Alcohol dependence with withdrawal, uncomplicated: Secondary | ICD-10-CM | POA: Diagnosis not present

## 2023-12-29 DIAGNOSIS — F17200 Nicotine dependence, unspecified, uncomplicated: Secondary | ICD-10-CM | POA: Diagnosis not present

## 2023-12-29 DIAGNOSIS — F1220 Cannabis dependence, uncomplicated: Secondary | ICD-10-CM | POA: Diagnosis not present

## 2023-12-29 DIAGNOSIS — F1420 Cocaine dependence, uncomplicated: Secondary | ICD-10-CM | POA: Diagnosis not present

## 2023-12-29 DIAGNOSIS — F900 Attention-deficit hyperactivity disorder, predominantly inattentive type: Secondary | ICD-10-CM | POA: Diagnosis not present

## 2023-12-29 DIAGNOSIS — F1020 Alcohol dependence, uncomplicated: Secondary | ICD-10-CM | POA: Diagnosis not present

## 2023-12-29 DIAGNOSIS — F1520 Other stimulant dependence, uncomplicated: Secondary | ICD-10-CM | POA: Diagnosis not present

## 2023-12-29 DIAGNOSIS — F1120 Opioid dependence, uncomplicated: Secondary | ICD-10-CM | POA: Diagnosis not present

## 2023-12-29 DIAGNOSIS — F4310 Post-traumatic stress disorder, unspecified: Secondary | ICD-10-CM | POA: Diagnosis not present

## 2023-12-30 DIAGNOSIS — F1120 Opioid dependence, uncomplicated: Secondary | ICD-10-CM | POA: Diagnosis not present

## 2023-12-30 DIAGNOSIS — F17200 Nicotine dependence, unspecified, uncomplicated: Secondary | ICD-10-CM | POA: Diagnosis not present

## 2023-12-30 DIAGNOSIS — F1520 Other stimulant dependence, uncomplicated: Secondary | ICD-10-CM | POA: Diagnosis not present

## 2023-12-30 DIAGNOSIS — F900 Attention-deficit hyperactivity disorder, predominantly inattentive type: Secondary | ICD-10-CM | POA: Diagnosis not present

## 2023-12-30 DIAGNOSIS — F4310 Post-traumatic stress disorder, unspecified: Secondary | ICD-10-CM | POA: Diagnosis not present

## 2023-12-30 DIAGNOSIS — F1020 Alcohol dependence, uncomplicated: Secondary | ICD-10-CM | POA: Diagnosis not present

## 2023-12-30 DIAGNOSIS — F1220 Cannabis dependence, uncomplicated: Secondary | ICD-10-CM | POA: Diagnosis not present

## 2023-12-30 DIAGNOSIS — F10230 Alcohol dependence with withdrawal, uncomplicated: Secondary | ICD-10-CM | POA: Diagnosis not present

## 2023-12-30 DIAGNOSIS — F1420 Cocaine dependence, uncomplicated: Secondary | ICD-10-CM | POA: Diagnosis not present

## 2023-12-31 DIAGNOSIS — F1520 Other stimulant dependence, uncomplicated: Secondary | ICD-10-CM | POA: Diagnosis not present

## 2023-12-31 DIAGNOSIS — F10230 Alcohol dependence with withdrawal, uncomplicated: Secondary | ICD-10-CM | POA: Diagnosis not present

## 2023-12-31 DIAGNOSIS — F17200 Nicotine dependence, unspecified, uncomplicated: Secondary | ICD-10-CM | POA: Diagnosis not present

## 2023-12-31 DIAGNOSIS — F1120 Opioid dependence, uncomplicated: Secondary | ICD-10-CM | POA: Diagnosis not present

## 2023-12-31 DIAGNOSIS — F1220 Cannabis dependence, uncomplicated: Secondary | ICD-10-CM | POA: Diagnosis not present

## 2023-12-31 DIAGNOSIS — F1020 Alcohol dependence, uncomplicated: Secondary | ICD-10-CM | POA: Diagnosis not present

## 2023-12-31 DIAGNOSIS — F1420 Cocaine dependence, uncomplicated: Secondary | ICD-10-CM | POA: Diagnosis not present

## 2023-12-31 DIAGNOSIS — F4310 Post-traumatic stress disorder, unspecified: Secondary | ICD-10-CM | POA: Diagnosis not present

## 2023-12-31 DIAGNOSIS — F900 Attention-deficit hyperactivity disorder, predominantly inattentive type: Secondary | ICD-10-CM | POA: Diagnosis not present

## 2024-01-01 DIAGNOSIS — F1020 Alcohol dependence, uncomplicated: Secondary | ICD-10-CM | POA: Diagnosis not present

## 2024-01-01 DIAGNOSIS — F10230 Alcohol dependence with withdrawal, uncomplicated: Secondary | ICD-10-CM | POA: Diagnosis not present

## 2024-01-01 DIAGNOSIS — F1420 Cocaine dependence, uncomplicated: Secondary | ICD-10-CM | POA: Diagnosis not present

## 2024-01-01 DIAGNOSIS — F1220 Cannabis dependence, uncomplicated: Secondary | ICD-10-CM | POA: Diagnosis not present

## 2024-01-01 DIAGNOSIS — F1120 Opioid dependence, uncomplicated: Secondary | ICD-10-CM | POA: Diagnosis not present

## 2024-01-01 DIAGNOSIS — F900 Attention-deficit hyperactivity disorder, predominantly inattentive type: Secondary | ICD-10-CM | POA: Diagnosis not present

## 2024-01-01 DIAGNOSIS — F17200 Nicotine dependence, unspecified, uncomplicated: Secondary | ICD-10-CM | POA: Diagnosis not present

## 2024-01-01 DIAGNOSIS — F1520 Other stimulant dependence, uncomplicated: Secondary | ICD-10-CM | POA: Diagnosis not present

## 2024-01-01 DIAGNOSIS — F4310 Post-traumatic stress disorder, unspecified: Secondary | ICD-10-CM | POA: Diagnosis not present

## 2024-01-02 DIAGNOSIS — F4310 Post-traumatic stress disorder, unspecified: Secondary | ICD-10-CM | POA: Diagnosis not present

## 2024-01-02 DIAGNOSIS — F1420 Cocaine dependence, uncomplicated: Secondary | ICD-10-CM | POA: Diagnosis not present

## 2024-01-02 DIAGNOSIS — F1520 Other stimulant dependence, uncomplicated: Secondary | ICD-10-CM | POA: Diagnosis not present

## 2024-01-02 DIAGNOSIS — F17200 Nicotine dependence, unspecified, uncomplicated: Secondary | ICD-10-CM | POA: Diagnosis not present

## 2024-01-02 DIAGNOSIS — F10230 Alcohol dependence with withdrawal, uncomplicated: Secondary | ICD-10-CM | POA: Diagnosis not present

## 2024-01-02 DIAGNOSIS — F1220 Cannabis dependence, uncomplicated: Secondary | ICD-10-CM | POA: Diagnosis not present

## 2024-01-02 DIAGNOSIS — F900 Attention-deficit hyperactivity disorder, predominantly inattentive type: Secondary | ICD-10-CM | POA: Diagnosis not present

## 2024-01-02 DIAGNOSIS — F1120 Opioid dependence, uncomplicated: Secondary | ICD-10-CM | POA: Diagnosis not present

## 2024-01-02 DIAGNOSIS — F1020 Alcohol dependence, uncomplicated: Secondary | ICD-10-CM | POA: Diagnosis not present

## 2024-01-03 DIAGNOSIS — F1120 Opioid dependence, uncomplicated: Secondary | ICD-10-CM | POA: Diagnosis not present

## 2024-01-03 DIAGNOSIS — F1420 Cocaine dependence, uncomplicated: Secondary | ICD-10-CM | POA: Diagnosis not present

## 2024-01-03 DIAGNOSIS — F4310 Post-traumatic stress disorder, unspecified: Secondary | ICD-10-CM | POA: Diagnosis not present

## 2024-01-03 DIAGNOSIS — F1220 Cannabis dependence, uncomplicated: Secondary | ICD-10-CM | POA: Diagnosis not present

## 2024-01-03 DIAGNOSIS — F10230 Alcohol dependence with withdrawal, uncomplicated: Secondary | ICD-10-CM | POA: Diagnosis not present

## 2024-01-03 DIAGNOSIS — F1020 Alcohol dependence, uncomplicated: Secondary | ICD-10-CM | POA: Diagnosis not present

## 2024-01-03 DIAGNOSIS — F1520 Other stimulant dependence, uncomplicated: Secondary | ICD-10-CM | POA: Diagnosis not present

## 2024-01-03 DIAGNOSIS — F900 Attention-deficit hyperactivity disorder, predominantly inattentive type: Secondary | ICD-10-CM | POA: Diagnosis not present

## 2024-01-03 DIAGNOSIS — F17200 Nicotine dependence, unspecified, uncomplicated: Secondary | ICD-10-CM | POA: Diagnosis not present

## 2024-01-04 DIAGNOSIS — F1520 Other stimulant dependence, uncomplicated: Secondary | ICD-10-CM | POA: Diagnosis not present

## 2024-01-04 DIAGNOSIS — F900 Attention-deficit hyperactivity disorder, predominantly inattentive type: Secondary | ICD-10-CM | POA: Diagnosis not present

## 2024-01-04 DIAGNOSIS — F10230 Alcohol dependence with withdrawal, uncomplicated: Secondary | ICD-10-CM | POA: Diagnosis not present

## 2024-01-04 DIAGNOSIS — F1220 Cannabis dependence, uncomplicated: Secondary | ICD-10-CM | POA: Diagnosis not present

## 2024-01-04 DIAGNOSIS — F1120 Opioid dependence, uncomplicated: Secondary | ICD-10-CM | POA: Diagnosis not present

## 2024-01-04 DIAGNOSIS — F17200 Nicotine dependence, unspecified, uncomplicated: Secondary | ICD-10-CM | POA: Diagnosis not present

## 2024-01-04 DIAGNOSIS — F1420 Cocaine dependence, uncomplicated: Secondary | ICD-10-CM | POA: Diagnosis not present

## 2024-01-04 DIAGNOSIS — F4310 Post-traumatic stress disorder, unspecified: Secondary | ICD-10-CM | POA: Diagnosis not present

## 2024-01-04 DIAGNOSIS — F1020 Alcohol dependence, uncomplicated: Secondary | ICD-10-CM | POA: Diagnosis not present

## 2024-01-05 DIAGNOSIS — F10230 Alcohol dependence with withdrawal, uncomplicated: Secondary | ICD-10-CM | POA: Diagnosis not present

## 2024-01-05 DIAGNOSIS — F1020 Alcohol dependence, uncomplicated: Secondary | ICD-10-CM | POA: Diagnosis not present

## 2024-01-05 DIAGNOSIS — F17200 Nicotine dependence, unspecified, uncomplicated: Secondary | ICD-10-CM | POA: Diagnosis not present

## 2024-01-05 DIAGNOSIS — F1120 Opioid dependence, uncomplicated: Secondary | ICD-10-CM | POA: Diagnosis not present

## 2024-01-05 DIAGNOSIS — F900 Attention-deficit hyperactivity disorder, predominantly inattentive type: Secondary | ICD-10-CM | POA: Diagnosis not present

## 2024-01-05 DIAGNOSIS — F1520 Other stimulant dependence, uncomplicated: Secondary | ICD-10-CM | POA: Diagnosis not present

## 2024-01-05 DIAGNOSIS — F4310 Post-traumatic stress disorder, unspecified: Secondary | ICD-10-CM | POA: Diagnosis not present

## 2024-01-05 DIAGNOSIS — F1220 Cannabis dependence, uncomplicated: Secondary | ICD-10-CM | POA: Diagnosis not present

## 2024-01-05 DIAGNOSIS — F1420 Cocaine dependence, uncomplicated: Secondary | ICD-10-CM | POA: Diagnosis not present

## 2024-01-29 ENCOUNTER — Other Ambulatory Visit: Payer: Self-pay

## 2024-01-29 ENCOUNTER — Emergency Department (HOSPITAL_COMMUNITY)
Admission: EM | Admit: 2024-01-29 | Discharge: 2024-01-29 | Disposition: A | Discharge status: Home / Self Care | Attending: Emergency Medicine | Admitting: Emergency Medicine

## 2024-01-29 ENCOUNTER — Emergency Department (HOSPITAL_COMMUNITY)

## 2024-01-29 DIAGNOSIS — R0602 Shortness of breath: Secondary | ICD-10-CM | POA: Diagnosis not present

## 2024-01-29 DIAGNOSIS — R9431 Abnormal electrocardiogram [ECG] [EKG]: Secondary | ICD-10-CM | POA: Insufficient documentation

## 2024-01-29 DIAGNOSIS — I499 Cardiac arrhythmia, unspecified: Secondary | ICD-10-CM | POA: Diagnosis not present

## 2024-01-29 DIAGNOSIS — F32A Depression, unspecified: Secondary | ICD-10-CM | POA: Diagnosis not present

## 2024-01-29 DIAGNOSIS — I213 ST elevation (STEMI) myocardial infarction of unspecified site: Secondary | ICD-10-CM | POA: Diagnosis not present

## 2024-01-29 LAB — BASIC METABOLIC PANEL WITH GFR
Anion gap: 12 (ref 5–15)
BUN: 18 mg/dL (ref 6–20)
CO2: 26 mmol/L (ref 22–32)
Calcium: 8.8 mg/dL — ABNORMAL LOW (ref 8.9–10.3)
Chloride: 100 mmol/L (ref 98–111)
Creatinine, Ser: 1.17 mg/dL (ref 0.61–1.24)
GFR, Estimated: >60 mL/min (ref >60)
Glucose, Bld: 102 mg/dL — ABNORMAL HIGH (ref 70–99)
Potassium: 4.4 mmol/L (ref 3.5–5.1)
Sodium: 138 mmol/L (ref 135–145)

## 2024-01-29 LAB — CBC
HCT: 43.3 % (ref 39.0–52.0)
Hemoglobin: 14.4 g/dL (ref 13.0–17.0)
MCH: 31.2 pg (ref 26.0–34.0)
MCHC: 33.3 g/dL (ref 30.0–36.0)
MCV: 93.9 fL (ref 80.0–100.0)
Platelets: 181 K/uL (ref 150–400)
RBC: 4.61 MIL/uL (ref 4.22–5.81)
RDW: 12.2 % (ref 11.5–15.5)
WBC: 6 K/uL (ref 4.0–10.5)
nRBC: 0 % (ref 0.0–0.2)

## 2024-01-29 LAB — SEDIMENTATION RATE: Sed Rate: 1 mm/h (ref 0–16)

## 2024-01-29 LAB — TROPONIN I (HIGH SENSITIVITY)
Troponin I (High Sensitivity): 5 ng/L (ref <18)
Troponin I (High Sensitivity): 5 ng/L (ref <18)
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
## 2024-01-29 NOTE — ED Notes (Addendum)
 Patient visibly irritated and states he is aggravated he has to wait for labs when he knew he was fine. RN explained to patient that we are waiting for troponin to come back for medical clearance at [facility]. PA notified. PA at bedside.

## 2024-01-29 NOTE — Discharge Instructions (Addendum)
 Your laboratories also are within normal limits today.  Please follow-up with your primary care physician at your earliest convenience.  Express any chest pain, shortness of breath, worsening symptoms please return emergency department.
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
## 2024-01-29 NOTE — ED Notes (Signed)
 Patient dc by RN. Report given to RN at [facility]. Safe transport to be called by diplomatic services operational officer. Ambulatory at time of discharge.

## 2024-01-30 DIAGNOSIS — F1020 Alcohol dependence, uncomplicated: Secondary | ICD-10-CM | POA: Diagnosis not present

## 2024-02-04 DIAGNOSIS — F1020 Alcohol dependence, uncomplicated: Secondary | ICD-10-CM | POA: Diagnosis not present

## 2024-02-06 DIAGNOSIS — F1020 Alcohol dependence, uncomplicated: Secondary | ICD-10-CM | POA: Diagnosis not present

## 2024-02-12 DIAGNOSIS — F1020 Alcohol dependence, uncomplicated: Secondary | ICD-10-CM | POA: Diagnosis not present
